# Patient Record
Sex: Female | Born: 1940 | Race: White | Hispanic: No | Marital: Single | Smoking: Never smoker
Health system: Southern US, Community
[De-identification: ages and names within clinical notes are randomized; demographics above are authoritative.]

## PROBLEM LIST (undated history)

## (undated) DIAGNOSIS — T4145XA Adverse effect of unspecified anesthetic, initial encounter: Secondary | ICD-10-CM

## (undated) DIAGNOSIS — F32A Depression, unspecified: Secondary | ICD-10-CM

## (undated) DIAGNOSIS — I272 Pulmonary hypertension, unspecified: Secondary | ICD-10-CM

## (undated) DIAGNOSIS — G629 Polyneuropathy, unspecified: Secondary | ICD-10-CM

## (undated) DIAGNOSIS — K589 Irritable bowel syndrome without diarrhea: Secondary | ICD-10-CM

## (undated) DIAGNOSIS — J342 Deviated nasal septum: Secondary | ICD-10-CM

## (undated) DIAGNOSIS — C50919 Malignant neoplasm of unspecified site of unspecified female breast: Secondary | ICD-10-CM

## (undated) DIAGNOSIS — T8859XA Other complications of anesthesia, initial encounter: Secondary | ICD-10-CM

## (undated) DIAGNOSIS — E7800 Pure hypercholesterolemia, unspecified: Secondary | ICD-10-CM

## (undated) DIAGNOSIS — Z9221 Personal history of antineoplastic chemotherapy: Secondary | ICD-10-CM

## (undated) DIAGNOSIS — E039 Hypothyroidism, unspecified: Secondary | ICD-10-CM

## (undated) DIAGNOSIS — G709 Myoneural disorder, unspecified: Secondary | ICD-10-CM

## (undated) DIAGNOSIS — I7300 Raynaud's syndrome without gangrene: Secondary | ICD-10-CM

## (undated) DIAGNOSIS — Z923 Personal history of irradiation: Secondary | ICD-10-CM

## (undated) DIAGNOSIS — M349 Systemic sclerosis, unspecified: Secondary | ICD-10-CM

## (undated) DIAGNOSIS — M1990 Unspecified osteoarthritis, unspecified site: Secondary | ICD-10-CM

## (undated) DIAGNOSIS — F419 Anxiety disorder, unspecified: Secondary | ICD-10-CM

## (undated) HISTORY — PX: APPENDECTOMY: SHX54

## (undated) HISTORY — PX: COLONOSCOPY: SHX174

## (undated) HISTORY — PX: CATARACT EXTRACTION: SUR2

## (undated) HISTORY — PX: NASAL SEPTUM SURGERY: SHX37

---

## 1986-06-25 DIAGNOSIS — C50919 Malignant neoplasm of unspecified site of unspecified female breast: Secondary | ICD-10-CM

## 1986-06-25 HISTORY — PX: MASTECTOMY: SHX3

## 1986-06-25 HISTORY — DX: Malignant neoplasm of unspecified site of unspecified female breast: C50.919

## 2004-10-29 ENCOUNTER — Ambulatory Visit: Payer: Self-pay | Admitting: Oncology

## 2004-12-31 ENCOUNTER — Ambulatory Visit: Payer: Self-pay | Admitting: Gastroenterology

## 2005-02-02 ENCOUNTER — Ambulatory Visit: Payer: Self-pay | Admitting: Oncology

## 2005-03-02 ENCOUNTER — Ambulatory Visit: Payer: Self-pay | Admitting: Oncology

## 2005-11-04 ENCOUNTER — Ambulatory Visit: Payer: Self-pay | Admitting: Oncology

## 2006-02-01 ENCOUNTER — Ambulatory Visit: Payer: Self-pay | Admitting: Oncology

## 2006-03-02 ENCOUNTER — Ambulatory Visit: Payer: Self-pay | Admitting: Oncology

## 2006-11-08 ENCOUNTER — Ambulatory Visit: Payer: Self-pay | Admitting: Oncology

## 2007-02-01 ENCOUNTER — Ambulatory Visit: Payer: Self-pay | Admitting: Oncology

## 2007-02-10 ENCOUNTER — Ambulatory Visit: Payer: Self-pay | Admitting: Oncology

## 2007-03-03 ENCOUNTER — Ambulatory Visit: Payer: Self-pay | Admitting: Oncology

## 2007-05-16 ENCOUNTER — Ambulatory Visit: Payer: Self-pay | Admitting: Oncology

## 2007-05-17 ENCOUNTER — Ambulatory Visit: Payer: Self-pay | Admitting: Oncology

## 2007-06-03 ENCOUNTER — Ambulatory Visit: Payer: Self-pay | Admitting: Oncology

## 2007-11-18 ENCOUNTER — Ambulatory Visit: Payer: Self-pay | Admitting: Oncology

## 2007-11-28 ENCOUNTER — Ambulatory Visit: Payer: Self-pay | Admitting: Oncology

## 2007-12-04 ENCOUNTER — Ambulatory Visit: Payer: Self-pay | Admitting: Oncology

## 2007-12-08 ENCOUNTER — Ambulatory Visit: Payer: Self-pay | Admitting: Internal Medicine

## 2008-02-06 ENCOUNTER — Ambulatory Visit: Payer: Self-pay | Admitting: Oncology

## 2008-02-28 ENCOUNTER — Ambulatory Visit: Payer: Self-pay | Admitting: Oncology

## 2008-03-02 ENCOUNTER — Ambulatory Visit: Payer: Self-pay | Admitting: Oncology

## 2008-05-02 ENCOUNTER — Ambulatory Visit: Payer: Self-pay | Admitting: Oncology

## 2008-12-12 ENCOUNTER — Ambulatory Visit: Payer: Self-pay | Admitting: Oncology

## 2009-01-31 ENCOUNTER — Ambulatory Visit: Payer: Self-pay | Admitting: Oncology

## 2009-02-26 ENCOUNTER — Ambulatory Visit: Payer: Self-pay | Admitting: Oncology

## 2009-03-02 ENCOUNTER — Ambulatory Visit: Payer: Self-pay | Admitting: Oncology

## 2009-12-03 ENCOUNTER — Ambulatory Visit: Payer: Self-pay | Admitting: Oncology

## 2009-12-31 ENCOUNTER — Ambulatory Visit: Payer: Self-pay | Admitting: Oncology

## 2010-02-25 ENCOUNTER — Ambulatory Visit: Payer: Self-pay | Admitting: Oncology

## 2010-03-02 ENCOUNTER — Ambulatory Visit: Payer: Self-pay | Admitting: Oncology

## 2010-12-24 ENCOUNTER — Ambulatory Visit: Payer: Self-pay | Admitting: Oncology

## 2010-12-30 ENCOUNTER — Ambulatory Visit: Payer: Self-pay | Admitting: Oncology

## 2011-01-01 ENCOUNTER — Ambulatory Visit: Payer: Self-pay | Admitting: Oncology

## 2011-11-30 ENCOUNTER — Ambulatory Visit: Payer: Self-pay | Admitting: Rheumatology

## 2012-01-01 ENCOUNTER — Ambulatory Visit: Payer: Self-pay | Admitting: Oncology

## 2012-01-05 ENCOUNTER — Ambulatory Visit: Payer: Self-pay | Admitting: Oncology

## 2012-01-11 ENCOUNTER — Ambulatory Visit: Payer: Self-pay | Admitting: Oncology

## 2012-01-11 LAB — COMPREHENSIVE METABOLIC PANEL
Anion Gap: 8 (ref 7–16)
BUN: 17 mg/dL (ref 7–18)
Calcium, Total: 9.4 mg/dL (ref 8.5–10.1)
Co2: 30 mmol/L (ref 21–32)
Creatinine: 1.03 mg/dL (ref 0.60–1.30)
EGFR (African American): >60
EGFR (Non-African Amer.): 56 — ABNORMAL LOW
Osmolality: 278 (ref 275–301)
Potassium: 4.3 mmol/L (ref 3.5–5.1)
SGOT(AST): 28 U/L (ref 15–37)
SGPT (ALT): 38 U/L

## 2012-01-11 LAB — CBC CANCER CENTER
Basophil %: 0.6 %
Eosinophil #: 0.2 x10 3/mm (ref 0.0–0.7)
Eosinophil %: 3.1 %
HCT: 37.7 % (ref 35.0–47.0)
HGB: 13.1 g/dL (ref 12.0–16.0)
MCH: 32.8 pg (ref 26.0–34.0)
MCHC: 34.8 g/dL (ref 32.0–36.0)
Monocyte #: 0.5 x10 3/mm (ref 0.0–0.7)
Monocyte %: 7.2 %
Neutrophil #: 4.6 x10 3/mm (ref 1.4–6.5)
Neutrophil %: 64.1 %
RBC: 3.99 10*6/uL (ref 3.80–5.20)

## 2012-02-01 ENCOUNTER — Ambulatory Visit: Payer: Self-pay | Admitting: Oncology

## 2012-04-26 ENCOUNTER — Ambulatory Visit: Payer: Self-pay | Admitting: Gastroenterology

## 2013-01-05 ENCOUNTER — Ambulatory Visit: Payer: Self-pay | Admitting: Oncology

## 2013-01-13 ENCOUNTER — Ambulatory Visit: Payer: Self-pay | Admitting: Oncology

## 2013-01-13 LAB — CBC CANCER CENTER
Eosinophil #: 0.2 x10 3/mm (ref 0.0–0.7)
Eosinophil %: 3.4 %
Lymphocyte #: 2 x10 3/mm (ref 1.0–3.6)
MCH: 31.6 pg (ref 26.0–34.0)
MCHC: 33.4 g/dL (ref 32.0–36.0)
Monocyte %: 7.6 %
Neutrophil #: 4.3 x10 3/mm (ref 1.4–6.5)
Neutrophil %: 59.4 %
Platelet: 274 x10 3/mm (ref 150–440)
RDW: 12.8 % (ref 11.5–14.5)
WBC: 7.2 x10 3/mm (ref 3.6–11.0)

## 2013-01-13 LAB — COMPREHENSIVE METABOLIC PANEL
Alkaline Phosphatase: 90 U/L (ref 50–136)
Bilirubin,Total: 0.8 mg/dL (ref 0.2–1.0)
EGFR (African American): >60
Glucose: 103 mg/dL — ABNORMAL HIGH (ref 65–99)
SGOT(AST): 25 U/L (ref 15–37)
SGPT (ALT): 46 U/L (ref 12–78)
Sodium: 137 mmol/L (ref 136–145)
Total Protein: 7.8 g/dL (ref 6.4–8.2)

## 2013-01-14 LAB — CANCER ANTIGEN 27.29: CA 27.29: 61.5 U/mL — ABNORMAL HIGH (ref 0.0–38.6)

## 2013-01-31 ENCOUNTER — Ambulatory Visit: Payer: Self-pay | Admitting: Oncology

## 2013-04-01 ENCOUNTER — Ambulatory Visit: Payer: Self-pay | Admitting: Emergency Medicine

## 2013-10-16 ENCOUNTER — Inpatient Hospital Stay: Payer: Self-pay | Admitting: Surgery

## 2013-10-16 LAB — COMPREHENSIVE METABOLIC PANEL
Albumin: 3.3 g/dL — ABNORMAL LOW (ref 3.4–5.0)
Alkaline Phosphatase: 75 U/L
Anion Gap: 5 — ABNORMAL LOW (ref 7–16)
Bilirubin,Total: 0.7 mg/dL (ref 0.2–1.0)
Calcium, Total: 9.2 mg/dL (ref 8.5–10.1)
Chloride: 102 mmol/L (ref 98–107)
Co2: 29 mmol/L (ref 21–32)
EGFR (African American): >60
Glucose: 127 mg/dL — ABNORMAL HIGH (ref 65–99)
SGOT(AST): 43 U/L — ABNORMAL HIGH (ref 15–37)
SGPT (ALT): 54 U/L (ref 12–78)
Sodium: 136 mmol/L (ref 136–145)
Total Protein: 7.8 g/dL (ref 6.4–8.2)

## 2013-10-16 LAB — CBC WITH DIFFERENTIAL/PLATELET
Basophil #: 0.1 10*3/uL (ref 0.0–0.1)
Basophil %: 1 %
Eosinophil #: 0 10*3/uL (ref 0.0–0.7)
HCT: 36.1 % (ref 35.0–47.0)
HGB: 12 g/dL (ref 12.0–16.0)
Lymphocyte #: 1.4 10*3/uL (ref 1.0–3.6)
Lymphocyte %: 11.4 %
MCH: 31.4 pg (ref 26.0–34.0)
MCHC: 33.2 g/dL (ref 32.0–36.0)
Monocyte #: 0.9 x10 3/mm (ref 0.2–0.9)
Monocyte %: 7.7 %
Platelet: 234 10*3/uL (ref 150–440)
RBC: 3.81 10*6/uL (ref 3.80–5.20)
RDW: 13.1 % (ref 11.5–14.5)
WBC: 11.9 10*3/uL — ABNORMAL HIGH (ref 3.6–11.0)

## 2013-10-16 LAB — URINALYSIS, COMPLETE
Bacteria: NONE SEEN
Bilirubin,UR: NEGATIVE
Blood: NEGATIVE
Glucose,UR: NEGATIVE mg/dL (ref 0–75)
Ph: 6 (ref 4.5–8.0)
Protein: NEGATIVE
RBC,UR: 1 /HPF (ref 0–5)
Squamous Epithelial: <1
WBC UR: 1 /HPF (ref 0–5)

## 2013-10-16 LAB — LIPASE, BLOOD: Lipase: 90 U/L (ref 73–393)

## 2013-10-17 LAB — BASIC METABOLIC PANEL
Anion Gap: 5 — ABNORMAL LOW (ref 7–16)
Calcium, Total: 8.4 mg/dL — ABNORMAL LOW (ref 8.5–10.1)
Creatinine: 0.85 mg/dL (ref 0.60–1.30)
EGFR (African American): >60
EGFR (Non-African Amer.): >60
Osmolality: 269 (ref 275–301)
Sodium: 133 mmol/L — ABNORMAL LOW (ref 136–145)

## 2013-10-17 LAB — CBC WITH DIFFERENTIAL/PLATELET
Basophil %: 0.1 %
Eosinophil #: 0 10*3/uL (ref 0.0–0.7)
Eosinophil %: 0 %
HCT: 34.3 % — ABNORMAL LOW (ref 35.0–47.0)
Lymphocyte %: 9 %
MCH: 32.5 pg (ref 26.0–34.0)
MCHC: 34.4 g/dL (ref 32.0–36.0)
MCV: 95 fL (ref 80–100)
Monocyte %: 9.3 %
Neutrophil #: 8.9 10*3/uL — ABNORMAL HIGH (ref 1.4–6.5)
RBC: 3.62 10*6/uL — ABNORMAL LOW (ref 3.80–5.20)
RDW: 13 % (ref 11.5–14.5)
WBC: 11 10*3/uL (ref 3.6–11.0)

## 2013-10-19 LAB — PATHOLOGY REPORT

## 2014-01-09 ENCOUNTER — Ambulatory Visit: Payer: Self-pay | Admitting: Oncology

## 2014-01-12 ENCOUNTER — Ambulatory Visit: Payer: Self-pay | Admitting: Oncology

## 2014-01-12 LAB — CBC CANCER CENTER
Basophil #: 0 x10 3/mm (ref 0.0–0.1)
Basophil %: 0.5 %
EOS ABS: 0.2 x10 3/mm (ref 0.0–0.7)
Eosinophil %: 3.3 %
HCT: 37.9 % (ref 35.0–47.0)
HGB: 12.8 g/dL (ref 12.0–16.0)
Lymphocyte #: 2.2 x10 3/mm (ref 1.0–3.6)
Lymphocyte %: 31.2 %
MCH: 31.9 pg (ref 26.0–34.0)
MCHC: 33.7 g/dL (ref 32.0–36.0)
MCV: 94 fL (ref 80–100)
MONO ABS: 0.6 x10 3/mm (ref 0.2–0.9)
Monocyte %: 8.7 %
Neutrophil #: 3.9 x10 3/mm (ref 1.4–6.5)
Neutrophil %: 56.3 %
Platelet: 329 x10 3/mm (ref 150–440)
RBC: 4.01 10*6/uL (ref 3.80–5.20)
RDW: 13 % (ref 11.5–14.5)
WBC: 6.9 x10 3/mm (ref 3.6–11.0)

## 2014-01-12 LAB — COMPREHENSIVE METABOLIC PANEL
ALT: 37 U/L (ref 12–78)
ANION GAP: 4 — AB (ref 7–16)
AST: 24 U/L (ref 15–37)
Albumin: 3.5 g/dL (ref 3.4–5.0)
Alkaline Phosphatase: 74 U/L
BUN: 17 mg/dL (ref 7–18)
Bilirubin,Total: 0.5 mg/dL (ref 0.2–1.0)
CALCIUM: 8.7 mg/dL (ref 8.5–10.1)
Chloride: 99 mmol/L (ref 98–107)
Co2: 32 mmol/L (ref 21–32)
Creatinine: 0.99 mg/dL (ref 0.60–1.30)
GFR CALC NON AF AMER: 57 — AB
GLUCOSE: 94 mg/dL (ref 65–99)
Osmolality: 271 (ref 275–301)
POTASSIUM: 4.2 mmol/L (ref 3.5–5.1)
Sodium: 135 mmol/L — ABNORMAL LOW (ref 136–145)
Total Protein: 7.5 g/dL (ref 6.4–8.2)

## 2014-01-26 DIAGNOSIS — F32A Depression, unspecified: Secondary | ICD-10-CM | POA: Insufficient documentation

## 2014-01-26 DIAGNOSIS — F329 Major depressive disorder, single episode, unspecified: Secondary | ICD-10-CM | POA: Insufficient documentation

## 2014-01-26 DIAGNOSIS — M8580 Other specified disorders of bone density and structure, unspecified site: Secondary | ICD-10-CM | POA: Insufficient documentation

## 2014-01-26 DIAGNOSIS — G4700 Insomnia, unspecified: Secondary | ICD-10-CM | POA: Insufficient documentation

## 2014-01-26 DIAGNOSIS — M19049 Primary osteoarthritis, unspecified hand: Secondary | ICD-10-CM | POA: Insufficient documentation

## 2014-01-26 DIAGNOSIS — M349 Systemic sclerosis, unspecified: Secondary | ICD-10-CM | POA: Insufficient documentation

## 2014-01-26 DIAGNOSIS — C50919 Malignant neoplasm of unspecified site of unspecified female breast: Secondary | ICD-10-CM | POA: Insufficient documentation

## 2014-01-26 DIAGNOSIS — E7800 Pure hypercholesterolemia, unspecified: Secondary | ICD-10-CM | POA: Insufficient documentation

## 2014-01-26 DIAGNOSIS — M1712 Unilateral primary osteoarthritis, left knee: Secondary | ICD-10-CM | POA: Insufficient documentation

## 2014-01-26 DIAGNOSIS — Z853 Personal history of malignant neoplasm of breast: Secondary | ICD-10-CM | POA: Insufficient documentation

## 2014-01-31 ENCOUNTER — Ambulatory Visit: Payer: Self-pay | Admitting: Oncology

## 2014-12-11 IMAGING — MG MM DIGITAL DIAGNOSTIC UNILAT*L* W/ CAD
1 series · 3 of 3 positions shown · non-contrast
Comparison: none

REASON FOR EXAM: hx brst ca rt mast
COMMENTS:

[L CC · left · 3 of 3 slices shown]
[im 1/3]
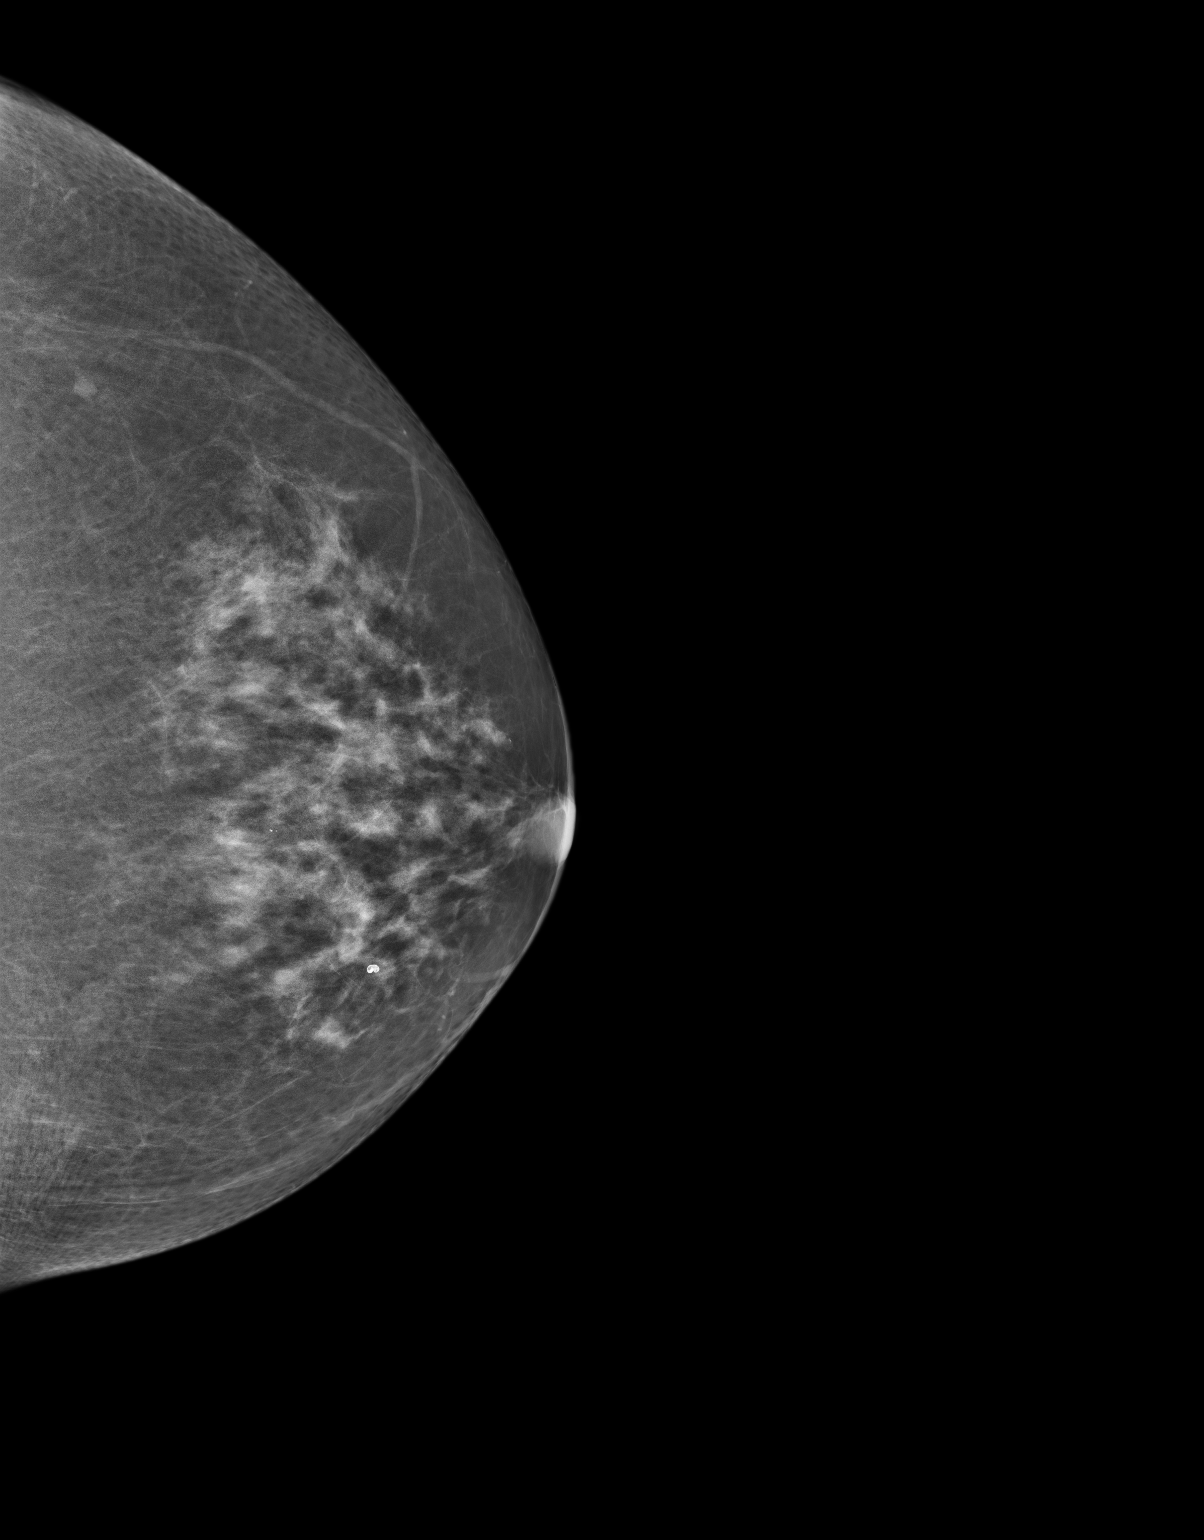
[im 2/3]
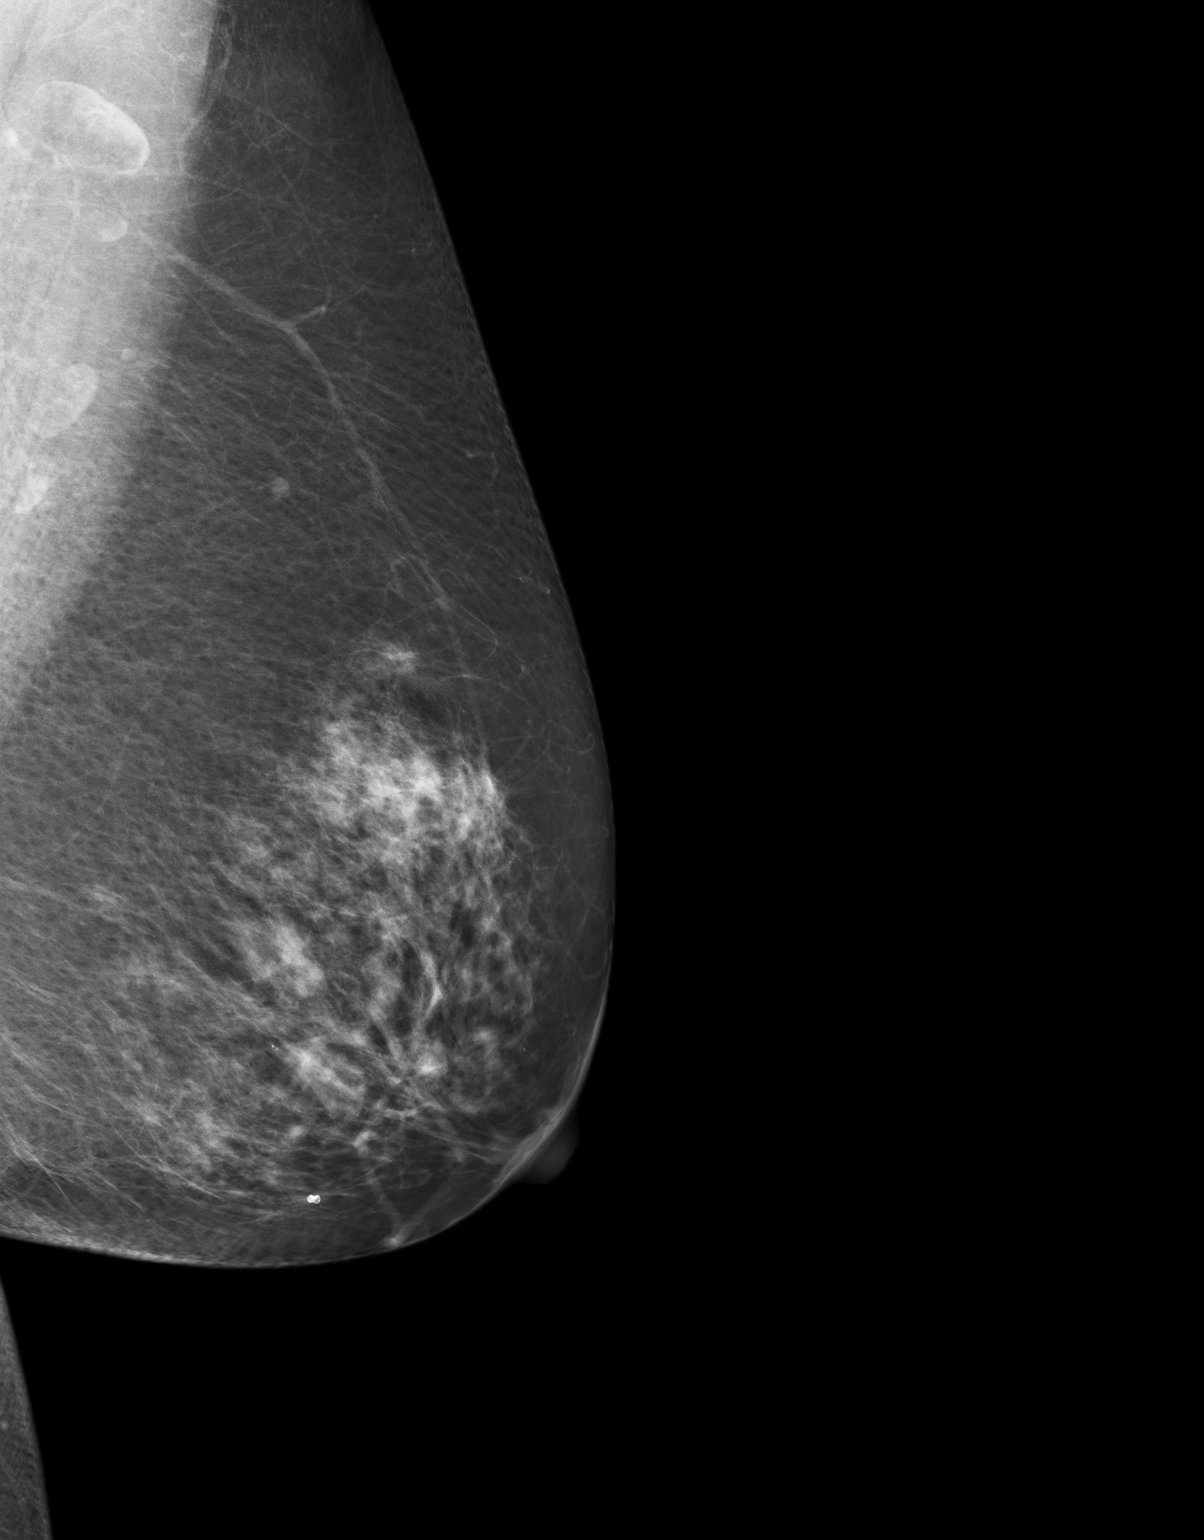
[im 3/3]
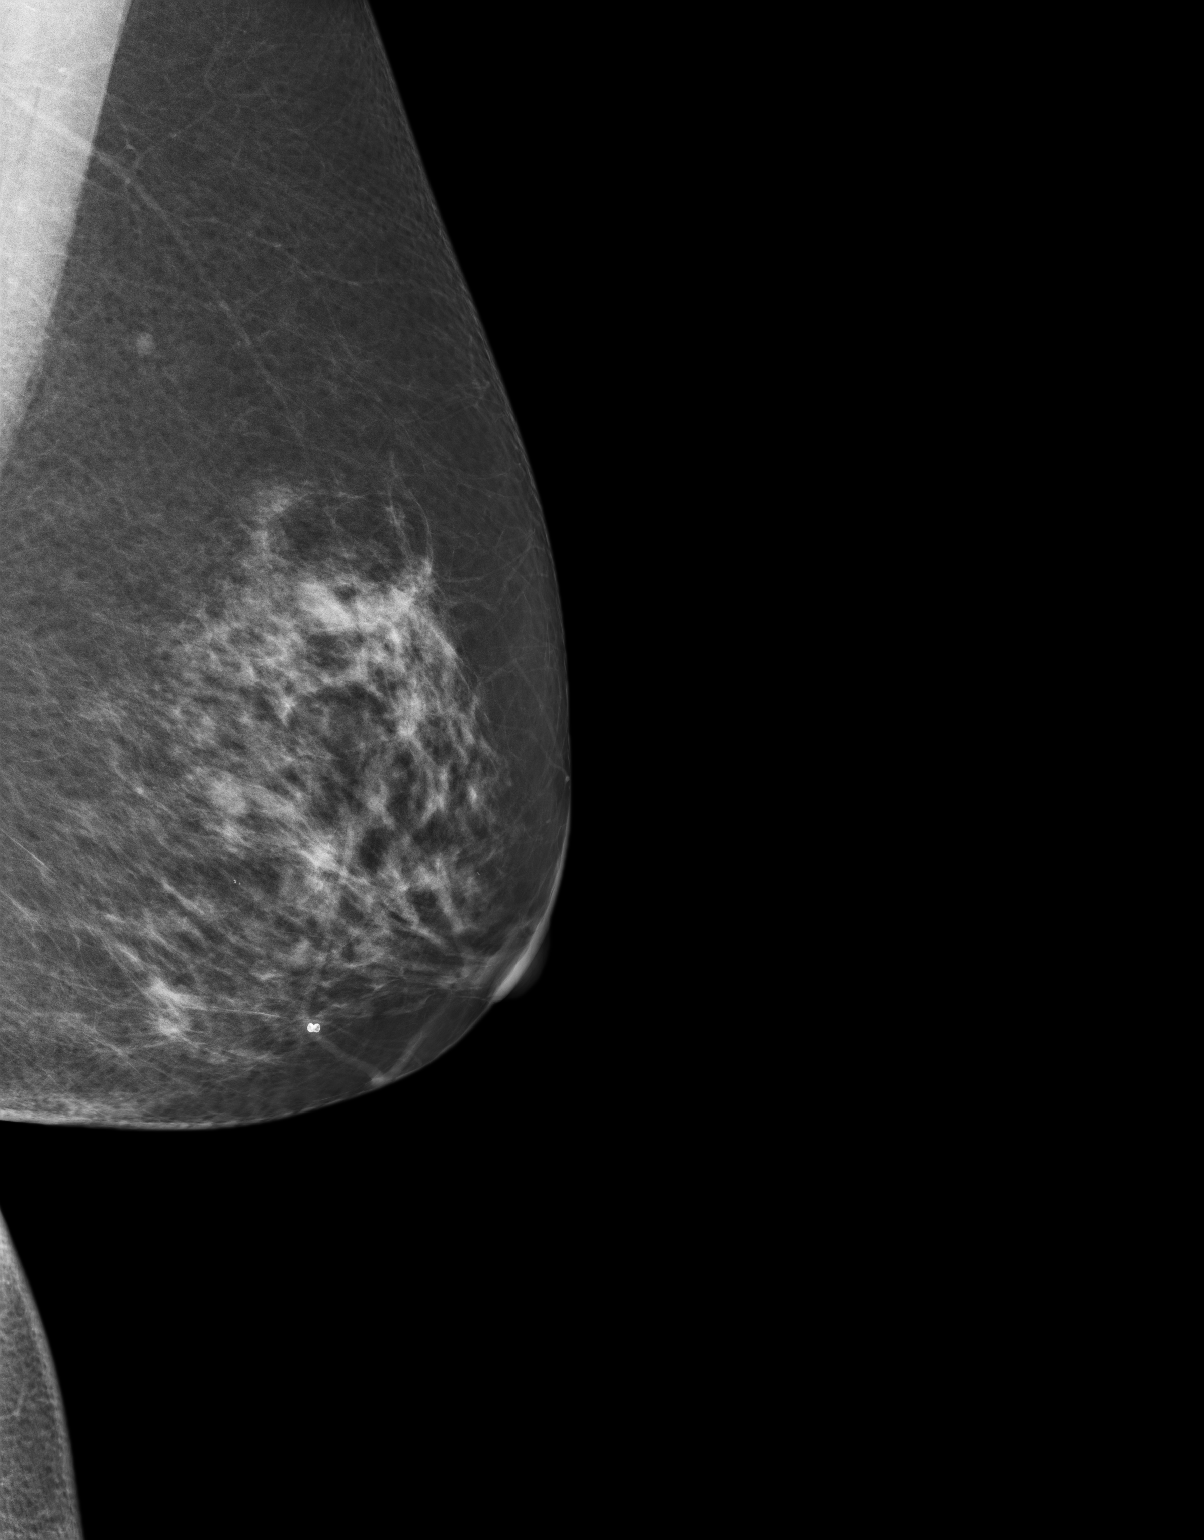

[3 of 3 positions shown; findings below may reference images not displayed]

PROCEDURE:     MMM - MMM DIG UNILATERAL LT W/CAD  - January 05, 2013 [DATE]

RESULT:     The patient has a history of right breast cancer diagnosed in
[age] undergoing mastectomy with radiation therapy and chemotherapy. There is
a family history of breast cancer in a paternal aunt. Comparison is made to
unilateral digital images from 05 January, 2012, [REDACTED] and December 2010. Left breast images demonstrate a moderate density with
scattered fibroglandular elements without a developing density or dominant
mass. No malignant calcification or architectural distortion is evident.
IMPRESSION: Stable, benign appearing unilateral left breast mammogram.
Please continue to encourage appropriate mammographic followup and monthly
breast self exam. BREAST COMPOSITION: The breast composition is SCATTERED
FIBROGLANDULAR TISSUE (glandular tissue is 25-50%) BI-RADS: Category 2-
Benign Finding

A NEGATIVE MAMMOGRAM REPORT DOES NOT PRECLUDE BIOPSY OR OTHER EVALUATION OF
A CLINICALLY PALPABLE OR OTHERWISE SUSPICIOUS MASS OR LESION. BREAST CANCER
MAY NOT BE DETECTED IN UP TO 10% OF CASES.

[REDACTED]

## 2015-01-29 ENCOUNTER — Ambulatory Visit: Payer: Self-pay | Admitting: Oncology

## 2015-01-31 ENCOUNTER — Ambulatory Visit
Admit: 2015-01-31 | Disposition: A | Discharge status: Home / Self Care | Payer: Self-pay | Attending: Oncology | Admitting: Oncology

## 2015-01-31 LAB — CBC CANCER CENTER
BASOS ABS: 0.1 x10 3/mm (ref 0.0–0.1)
BASOS PCT: 0.7 %
EOS ABS: 0.1 x10 3/mm (ref 0.0–0.7)
Eosinophil %: 1.2 %
HCT: 37.9 % (ref 35.0–47.0)
HGB: 12.9 g/dL (ref 12.0–16.0)
LYMPHS ABS: 2 x10 3/mm (ref 1.0–3.6)
Lymphocyte %: 27.9 %
MCH: 32.1 pg (ref 26.0–34.0)
MCHC: 33.9 g/dL (ref 32.0–36.0)
MCV: 95 fL (ref 80–100)
MONO ABS: 0.6 x10 3/mm (ref 0.2–0.9)
Monocyte %: 8.2 %
Neutrophil #: 4.4 x10 3/mm (ref 1.4–6.5)
Neutrophil %: 62 %
Platelet: 282 x10 3/mm (ref 150–440)
RBC: 4.01 10*6/uL (ref 3.80–5.20)
RDW: 13 % (ref 11.5–14.5)
WBC: 7.1 x10 3/mm (ref 3.6–11.0)

## 2015-01-31 LAB — COMPREHENSIVE METABOLIC PANEL WITH GFR
Albumin: 4.5 g/dL
Alkaline Phosphatase: 47 U/L
Anion Gap: 3 — ABNORMAL LOW
BUN: 18 mg/dL
Bilirubin,Total: 1.1 mg/dL
Calcium, Total: 10.2 mg/dL
Chloride: 101 mmol/L
Co2: 31 mmol/L
Creatinine: 0.93 mg/dL
EGFR (African American): >60
EGFR (Non-African Amer.): >60
Glucose: 114 mg/dL — ABNORMAL HIGH
Potassium: 4.6 mmol/L
SGOT(AST): 29 U/L
SGPT (ALT): 24 U/L
Sodium: 135 mmol/L
Total Protein: 7.9 g/dL

## 2015-02-01 ENCOUNTER — Ambulatory Visit
Admit: 2015-02-01 | Disposition: A | Discharge status: Home / Self Care | Payer: Self-pay | Attending: Oncology | Admitting: Oncology

## 2015-02-22 NOTE — Op Note (Signed)
PATIENT NAME:  Veronica Hoffman, Veronica Hoffman MR#:  [id number] DATE OF BIRTH:  [birth year]  DATE OF PROCEDURE:  10/16/2013  PREOPERATIVE DIAGNOSIS: Acute appendicitis.   POSTOPERATIVE DIAGNOSIS: Acute appendicitis.   PROCEDURE PERFORMED: Laparoscopic appendectomy.   SURGEON: Norva Bowe A. Marina Gravel, M.D. FACS  ASSISTANT: ST  ANESTHESIA: General endotracheal.   FINDINGS: Acute appendicitis with microperforation.   SPECIMENS: Appendix to pathology.   ESTIMATED BLOOD LOSS: Less than 25 mL.   DRAINS: None.   LAP AND NEEDLE COUNT: Correct x2.   DESCRIPTION OF PROCEDURE: With informed consent, supine position, general endotracheal anesthesia, the patient's arms were padded and tucked at her side. Her abdomen was widely prepped and draped with ChloraPrep solution. Timeout was observed. A 12 mm blunt Hassan trocar was placed through an open technique with stay sutures being passed through the fascia. Pneumoperitoneum was established. There were adhesions of the omentum to the anterior abdominal wall in the right side along the course of the mid ascending colon. This was taken down with sharp dissection after placement of a left lower quadrant 5 mm bladeless trocar. With sufficient abdominal wall right-of-way, I placed a 12 mm trocar in the right upper quadrant. There was free fluid and small amount of pus in the abdomen. In the right lower quadrant, dissection demonstrated an appendix wrapped around the terminal ileum. This was carefully teased away with a combination of blunt technique, sharp technique, and the application of the Harmonic scalpel device. The base of the appendix was clearly identified, the mesoappendix being taken with sequential small bites with the endoscopic Harmonic scalpel. The base of the appendix was clearly identified. The base of the appendix was then transected with a single fire of the endoscopic stapler with blue load application. The specimen was captured in an Endo Catch device and retrieved.  Pneumoperitoneum was re-established. The right lower quadrant was copiously irrigated and aspirated dry and hemostasis appeared to be excellent on the operative field. Ports were then removed under direct visualization with the infraumbilical fascial defect being reapproximated with an additional figure-of-eight #0 Vicryl suture in vertical orientation. Stay sutures being tied to each other, a total of 20 mL of 0.25% plain Marcaine was infiltrated prior to closure along all skin and fascial incisions. 4-0 Vicryl subcuticular followed by benzoin, Steri-Strips, Telfa, and Tegaderm were then applied, and the patient was subsequently extubated and taken to the recovery room in stable and satisfactory condition by anesthesia services. ____________________________ Jeannette How Marina Gravel, MD mab:sb D: 10/16/2013 16:32:31 ET T: 10/16/2013 17:14:51 ET JOB#: [id number]  cc: Elta Guadeloupe A. Marina Gravel, MD, <Dictator> Clyde Zarrella A Dacia Capers MD ELECTRONICALLY SIGNED 10/24/2013 8:30

## 2015-02-22 NOTE — H&P (Signed)
Subjective/Chief Complaint abdominal pain, nausea and diarrhea   History of Present Illness 74 y/o female with remote hx breast cancer presents to ER with several day history of vague abdominal pain associated with diarrhea on Thursday and nausea today.  No relief with OTC remedies.  Seen in ER and found to be tachycardic and CT scan shows acute appendicitis. Denies sick contacts and dysuria.   Past History breast cancer repair deviated nasal sectum.   Past Med/Surgical Hx:  spastic colon:   septal repair:   Mastectomy:   ALLERGIES:  Codeine: N/V/Diarrhea  Demerol: N/V/Diarrhea  Darvon: N/V/Diarrhea  Aspirin: Bleeding   Other Allergies none   HOME MEDICATIONS: Medication Instructions Status  NIFEdipine 30 mg oral tablet, extended release 1 tab(s) orally once a day Active  Lipitor 20 mg oral tablet 1 tab(s) orally once a day (at bedtime) Active  Lexapro 5 mg oral tablet 1 tab(s) orally once a day (at bedtime) Active   Family and Social History:  Family History Non-Contributory   Social History negative tobacco, negative ETOH, negative Illicit drugs   Place of Living Home   Review of Systems:  Subjective/Chief Complaint see above.   Abdominal Pain Yes   Diarrhea Yes   Nausea/Vomiting Yes  no vomiting.   Medications/Allergies Reviewed Medications/Allergies reviewed   Physical Exam:  GEN no acute distress, disheveled   HEENT pale conjunctivae, PERRL   NECK supple   RESP normal resp effort  clear BS   CARD regular rate  no murmur  no thrills  No LE edema  no JVD  no Rub   ABD positive tenderness  no hernia  soft  mild tenderness in lower mid abdomen.   LYMPH negative neck   EXTR negative cyanosis/clubbing   SKIN normal to palpation   NEURO cranial nerves intact   PSYCH A+O to time, place, person, good insight   Lab Results: Hepatic:  15-Dec-14 07:15   Bilirubin, Total 0.7  Alkaline Phosphatase 75 (45-117 NOTE: New Reference Range 09/22/13)   SGPT (ALT) 54  SGOT (AST)  43  Total Protein, Serum 7.8  Albumin, Serum  3.3  Routine Chem:  15-Dec-14 07:15   Glucose, Serum  127  BUN 10  Creatinine (comp) 0.76  Sodium, Serum 136  Potassium, Serum 3.9  Chloride, Serum 102  CO2, Serum 29  Calcium (Total), Serum 9.2  Osmolality (calc) 273  eGFR (African American) >60  eGFR (Non-African American) >60 (eGFR values <8mL/min/1.73 m2 may be an indication of chronic kidney disease (CKD). Calculated eGFR is useful in patients with stable renal function. The eGFR calculation will not be reliable in acutely ill patients when serum creatinine is changing rapidly. It is not useful in  patients on dialysis. The eGFR calculation may not be applicable to patients at the low and high extremes of body sizes, pregnant women, and vegetarians.)  Anion Gap  5  Lipase 90 (Result(s) reported on 16 Oct 2013 at 07:46AM.)  Routine Hem:  15-Dec-14 07:15   WBC (CBC)  11.9  RBC (CBC) 3.81  Hemoglobin (CBC) 12.0  Hematocrit (CBC) 36.1  Platelet Count (CBC) 234  MCV 95  MCH 31.4  MCHC 33.2  RDW 13.1  Neutrophil % 79.7  Lymphocyte % 11.4  Monocyte % 7.7  Eosinophil % 0.2  Basophil % 1.0  Neutrophil #  9.5  Lymphocyte # 1.4  Monocyte # 0.9  Eosinophil # 0.0  Basophil # 0.1 (Result(s) reported on 16 Oct 2013 at 07:35AM.)   Radiology Results:  XRay:    15-Dec-14 07:27, Abdomen 3 Way Includes PA Chest  Abdomen 3 Way Includes PA Chest  REASON FOR EXAM:    abd pain  COMMENTS:       PROCEDURE: DXR - DXR ABDOMEN 3-WAY (INCL PA CXR)  - Oct 16 2013  7:27AM     CLINICAL DATA:  Abdominal pain and diarrhea.    EXAM:  ACUTE ABDOMEN SERIES (2 VIEW ABDOMEN AND 1 VIEW CHEST)    COMPARISON:  None.    FINDINGS:  Frontal view of the chest shows midline trachea and normal heart  size. Mild biapical pleural thickening with linear scarring at the  right lung apex. Linear opacities are seen at the lung bases, left  greater than right. No  airspaceconsolidation or pleural fluid.    Two views of the abdomen show a fair amount of stool in the colon.  No small bowel dilatation. No unexpected radiopaque calculi.     IMPRESSION:  1. Bowel gas pattern is suggestive of constipation.  2. Bibasilar subsegmental atelectasis and/or scarring.      Electronically Signed    By: Lorin Picket M.D.    On: 10/16/2013 07:42     Verified By: Luretha Rued, M.D.,  LabUnknown:  PACS Image    15-Dec-14 09:20, CT Abdomen and Pelvis With Contrast  PACS Image  CT:  CT Abdomen and Pelvis With Contrast  REASON FOR EXAM:    (1) abd pain, tachycardia, leukocytosis; (2) abd   pain, tachycardia, leukocytosis  COMMENTS:   May transport without cardiac monitor    PROCEDURE: CT  - CT ABDOMEN / PELVIS  W  - Oct 16 2013  9:20AM     CLINICAL DATA:  Abdominal pain, leukocytosis, tachycardia.    EXAM:  CT ABDOMEN AND PELVIS WITH CONTRAST    TECHNIQUE:  Multidetector CT imaging of the abdomen and pelvis was performed  using the standard protocol following bolus administration of  intravenous contrast.  CONTRAST:  115 cc Isovue 370.    COMPARISON:  None.    FINDINGS:  Lung bases show scattered scarring. Suspect subpleural radiation  fibrosis in the right middle lobe. Heart is at the upper limits of  normal in size. No pericardial effusion. Probable small hiatal  hernia.    Liver, gallbladder and adrenal glands are unremarkable. There may be  scattered millimetric low-attenuation lesions in the kidneys, too  small to characterize. Spleen, pancreas, stomach and small bowel are  unremarkable.  The mid and distal portions of the appendix are low-attenuation and  dilated, measuring up to 1.5 cm (series 2, image 61). There is  surrounding inflammatory haziness and stranding. No discrete fluid  collection or free air. Scattered adjacent lymph nodes are  subcentimeter in size. Colon is otherwise unremarkable.    Uterus and ovaries are  visualized. Small bilateral inguinal hernias  contain fat. No pathologically enlarged lymph nodes. No free fluid.  Scattered atherosclerotic calcification of the arterial vasculature  without abdominal aortic aneurysm. No worrisome lytic or sclerotic  lesions.     IMPRESSION:  Dilated appendix with surrounding inflammatory changes, most  consistent with acute appendicitis. Although considered much less  likely, mucocele cannot be definitively excluded.      Electronically Signed    By: Lorin Picket M.D.    On: 10/16/2013 09:30         Verified By: Luretha Rued, M.D.,    Assessment/Admission Diagnosis 74 y/o female with acute appendicitis   Plan admit, laparoscopic  appendectomy, discussed with patient the procedure and risks of surgery including bleeding, infection, recovery and post op ileus.  She also understands need for possible open procedure.   Electronic Signatures: Sherri Rad (MD)  (Signed 15-Dec-14 12:14)  Authored: CHIEF COMPLAINT and HISTORY, PAST MEDICAL/SURGIAL HISTORY, ALLERGIES, Other Allergies, HOME MEDICATIONS, FAMILY AND SOCIAL HISTORY, REVIEW OF SYSTEMS, PHYSICAL EXAM, LABS, Radiology, ASSESSMENT AND PLAN   Last Updated: 15-Dec-14 12:14 by Sherri Rad (MD)

## 2015-02-23 NOTE — Discharge Summary (Signed)
PATIENT NAME:  Veronica Hoffman, Veronica Hoffman MR#:  [id number] DATE OF BIRTH:  1940-12-10  DATE OF ADMISSION:  10/16/2013 DATE OF DISCHARGE:  10/20/2013  FINAL DIAGNOSIS:  Acute perforated appendicitis.   HOSPITAL COURSE SUMMARY: The patient was admitted with abdominal pain, found to have acute appendicitis on the 15th of December.  Had a laparoscopic appendectomy with uneventful postoperative course. She did have a mild postoperative ileus. On postoperative day #1, she  seemed to be doing well. On postoperative day #2, she had several loose stools and passage of gas and was continued on intravenous ertapenem.  Her diet was able to be advanced from n.p.o. to clear liquids. On postoperative day #3, she had multiple bowel movements. C. difficile assay was obtained. This came back negative. On postoperative day #4, she was tolerating a regular diet. Her incision seemed to be healing nicely with no signs of infection. She was discharged home on oral antibiotics to follow up with me in the office in 1 week's time.   DISCHARGE MEDICATIONS: 1.  Nifedipine 30 mg extended release tablet once a day.  2.  Lipitor 20 mg by mouth once a day.  3.  Lexapro 5 mg by mouth once a day.  4.  Acetaminophen/oxycodone 5/325, 1 tab every 6 hours.  5.  Protonix 40 mg by mouth delayed-release tablet, 1 tab by mouth once a day.  6.  Augmentin 500/125, 1 tab every 8 hours for 5 more days.   ____________________________ Jeannette How Marina Gravel, MD mab:dmm D: 11/01/2013 20:32:03 ET T: 11/01/2013 21:56:25 ET JOB#: [id number]  cc: Elta Guadeloupe A. Marina Gravel, MD, <Dictator> Hortencia Conradi MD ELECTRONICALLY SIGNED 11/02/2013 19:53

## 2015-02-27 LAB — CANCER ANTIGEN 27.29: CA 27.29: 62.2 U/mL — ABNORMAL HIGH (ref 0.0–38.6)

## 2015-06-07 ENCOUNTER — Ambulatory Visit
Admission: RE | Admit: 2015-06-07 | Discharge: 2015-06-07 | Disposition: A | Discharge status: Home / Self Care | Payer: Medicare Other | Source: Ambulatory Visit | Attending: Cardiology | Admitting: Cardiology

## 2015-06-07 ENCOUNTER — Encounter
Admission: RE | Disposition: A | Discharge status: Home / Self Care | Payer: Self-pay | Source: Ambulatory Visit | Attending: Cardiology

## 2015-06-07 DIAGNOSIS — Z888 Allergy status to other drugs, medicaments and biological substances status: Secondary | ICD-10-CM | POA: Diagnosis not present

## 2015-06-07 DIAGNOSIS — Z8249 Family history of ischemic heart disease and other diseases of the circulatory system: Secondary | ICD-10-CM | POA: Insufficient documentation

## 2015-06-07 DIAGNOSIS — I272 Other secondary pulmonary hypertension: Secondary | ICD-10-CM | POA: Diagnosis not present

## 2015-06-07 DIAGNOSIS — I7300 Raynaud's syndrome without gangrene: Secondary | ICD-10-CM | POA: Diagnosis not present

## 2015-06-07 DIAGNOSIS — E780 Pure hypercholesterolemia: Secondary | ICD-10-CM | POA: Diagnosis not present

## 2015-06-07 DIAGNOSIS — R000 Tachycardia, unspecified: Secondary | ICD-10-CM | POA: Diagnosis not present

## 2015-06-07 DIAGNOSIS — M8580 Other specified disorders of bone density and structure, unspecified site: Secondary | ICD-10-CM | POA: Diagnosis not present

## 2015-06-07 DIAGNOSIS — Z853 Personal history of malignant neoplasm of breast: Secondary | ICD-10-CM | POA: Insufficient documentation

## 2015-06-07 DIAGNOSIS — Z833 Family history of diabetes mellitus: Secondary | ICD-10-CM | POA: Insufficient documentation

## 2015-06-07 DIAGNOSIS — E785 Hyperlipidemia, unspecified: Secondary | ICD-10-CM | POA: Diagnosis not present

## 2015-06-07 DIAGNOSIS — Z825 Family history of asthma and other chronic lower respiratory diseases: Secondary | ICD-10-CM | POA: Insufficient documentation

## 2015-06-07 DIAGNOSIS — Z91048 Other nonmedicinal substance allergy status: Secondary | ICD-10-CM | POA: Diagnosis not present

## 2015-06-07 DIAGNOSIS — L900 Lichen sclerosus et atrophicus: Secondary | ICD-10-CM | POA: Insufficient documentation

## 2015-06-07 DIAGNOSIS — Z9889 Other specified postprocedural states: Secondary | ICD-10-CM | POA: Insufficient documentation

## 2015-06-07 DIAGNOSIS — M19049 Primary osteoarthritis, unspecified hand: Secondary | ICD-10-CM | POA: Insufficient documentation

## 2015-06-07 DIAGNOSIS — M170 Bilateral primary osteoarthritis of knee: Secondary | ICD-10-CM | POA: Diagnosis not present

## 2015-06-07 DIAGNOSIS — Z885 Allergy status to narcotic agent status: Secondary | ICD-10-CM | POA: Diagnosis not present

## 2015-06-07 DIAGNOSIS — M349 Systemic sclerosis, unspecified: Secondary | ICD-10-CM | POA: Insufficient documentation

## 2015-06-07 DIAGNOSIS — Z823 Family history of stroke: Secondary | ICD-10-CM | POA: Diagnosis not present

## 2015-06-07 DIAGNOSIS — Z9010 Acquired absence of unspecified breast and nipple: Secondary | ICD-10-CM | POA: Insufficient documentation

## 2015-06-07 DIAGNOSIS — Z79899 Other long term (current) drug therapy: Secondary | ICD-10-CM | POA: Diagnosis not present

## 2015-06-07 DIAGNOSIS — G4700 Insomnia, unspecified: Secondary | ICD-10-CM | POA: Insufficient documentation

## 2015-06-07 DIAGNOSIS — F329 Major depressive disorder, single episode, unspecified: Secondary | ICD-10-CM | POA: Diagnosis not present

## 2015-06-07 DIAGNOSIS — Z886 Allergy status to analgesic agent status: Secondary | ICD-10-CM | POA: Insufficient documentation

## 2015-06-07 DIAGNOSIS — Z9849 Cataract extraction status, unspecified eye: Secondary | ICD-10-CM | POA: Diagnosis not present

## 2015-06-07 HISTORY — PX: CARDIAC CATHETERIZATION: SHX172

## 2015-06-07 SURGERY — RIGHT HEART CATH
Anesthesia: Moderate Sedation

## 2015-06-07 MED ORDER — MIDAZOLAM HCL 2 MG/2ML IJ SOLN
INTRAMUSCULAR | Status: DC | PRN
  Administered 2015-06-07: 1 mg via INTRAVENOUS
  Administered 2015-06-07: 0.5 mg via INTRAVENOUS

## 2015-06-07 MED ORDER — FENTANYL CITRATE (PF) 100 MCG/2ML IJ SOLN
INTRAMUSCULAR | Status: DC | PRN
  Administered 2015-06-07: 25 ug via INTRAVENOUS
  Administered 2015-06-07: 50 ug via INTRAVENOUS

## 2015-06-07 MED ORDER — SODIUM CHLORIDE 0.9 % IV SOLN
INTRAVENOUS | Status: DC
  Administered 2015-06-07: 08:00:00 via INTRAVENOUS

## 2015-06-07 MED ORDER — FENTANYL CITRATE (PF) 100 MCG/2ML IJ SOLN
INTRAMUSCULAR | Status: AC
  Filled 2015-06-07: qty 2

## 2015-06-07 MED ORDER — MIDAZOLAM HCL 2 MG/2ML IJ SOLN
INTRAMUSCULAR | Status: AC
  Filled 2015-06-07: qty 2

## 2015-06-07 MED ORDER — HEPARIN (PORCINE) IN NACL 2-0.9 UNIT/ML-% IJ SOLN
INTRAMUSCULAR | Status: AC
  Filled 2015-06-07: qty 500

## 2015-06-07 SURGICAL SUPPLY — 5 items
CATH SWANZ 7F THERMO (CATHETERS) ×2 IMPLANT
KIT MANI 3VAL PERCEP (MISCELLANEOUS) ×2 IMPLANT
NEEDLE PERC 18GX7CM (NEEDLE) ×2 IMPLANT
PACK CARDIAC CATH (CUSTOM PROCEDURE TRAY) ×2 IMPLANT
SHEATH PINNACLE 7F 10CM (SHEATH) ×2 IMPLANT

## 2015-06-07 NOTE — Discharge Instructions (Addendum)

## 2015-06-07 NOTE — H&P (Signed)
Chief Complaint: Chief Complaint  Patient presents with  . Establish Care  per patient she had a sleep study and it showed tachycardia. Pt had echo that showed pulmonary hypertension but, dr Raul Del said no  Date of Service: 06/04/2015 Date of Birth: 09-05-1941 PCP: Sherrin Daisy, MD  History of Present Illness: Veronica Hoffman is a 74 y.o.female patient who presented referral for evaluation of pulmonary hypertension. Patient has a history of Raynaud's disease and hyperlipidemia. She recently had an echocardiogram which revealed evidence of mild pulmonary hypertension with a right ventricular systolic pressure estimated in the mid 40s. She also has scleroderma. She denies any shortness of breath. She underwent a sleep study which revealed borderline sleep apnea but no high-grade desaturation. She complains of some fatigue but no but no shortness of breath or chest pain. She was noted to have some nonsustained wide complex tachycardia during her sleep study. She does not complain of syncope or presyncope otherwise. She has a family history of coronary artery disease.  Past Medical and Surgical History  Past Medical History Past Medical History  Diagnosis Date  . Raynaud's disease  . Hyperlipidemia  . Insomnia  . Depression  . Osteoarthritis of both knees  . Osteoarthritis of hand  . Scleroderma  Positive FANA and anti Scl 70. Positive family history. Mildly elevated RVSP. Raynaud's.  . Cancer  Breast  . Osteopenia  . Lichen sclerosus  . Chickenpox  . Cataracts, bilateral   Past Surgical History She has past surgical history that includes lens eye surgery (Bilateral, 2013); Mastectomy; Cataract extraction; Appendectomy; Colonoscopy (04/26/2012); Nasal surgery; and Dilation and curettage, diagnostic / therapeutic.   Medications and Allergies  Current Medications  Current Outpatient Prescriptions  Medication Sig Dispense Refill  . acetaminophen (TYLENOL) 650 MG ER tablet Take 650 mg by mouth  every 8 (eight) hours as needed for Pain.  Marland Kitchen atorvastatin (LIPITOR) 40 MG tablet Take 1 tablet (40 mg total) by mouth once daily. 30 tablet 0  . calcium carbonate-vitamin D3 (OS-CAL 500+D) 500 mg(1,250mg ) -200 unit tablet Take 1 tablet by mouth 2 (two) times daily with meals.  . cholecalciferol (CHOLECALCIFEROL) 1,000 unit tablet Take 185 mg by mouth.  . escitalopram oxalate (LEXAPRO) 10 MG tablet Take 1 tablet (10 mg total) by mouth once daily. 90 tablet 1  . fenofibrate micronized (LOFIBRA) 67 MG capsule Take 1 capsule (67 mg total) by mouth once daily. 90 capsule 1  . gabapentin (NEURONTIN) 100 MG capsule Take 1 capsule (100 mg total) by mouth 3 (three) times daily. 90 capsule 11  . ginseng 100 mg Cap Take by mouth.  Marland Kitchen GLUC SU/CHONDRO SU A/VIT C/MN (GLUCOSAMINE 1500 COMPLEX ORAL) Take by mouth.  . loratadine (CLARITIN) 10 mg tablet Take 10 mg by mouth once daily.  . multivitamin tablet Take 1 tablet by mouth daily.  . niFEdipine (ADALAT CC) 30 MG ER tablet Take 1 tablet (30 mg total) by mouth once daily. 90 tablet 3  . UNABLE TO FIND Vitamin b12 1,000 mcg po once day  . vitamin E 400 UNIT capsule Take 400 Units by mouth daily.   No current facility-administered medications for this visit.   Allergies: Codeine; Meperidine; Propoxyphene; Adhesive bandage; Aspirin; Amlodipine; Boniva; and Fosamax  Social and Family History  Social History reports that she has never smoked. She has never used smokeless tobacco. She reports that she does not drink alcohol or use illicit drugs.  Family History Family History  Problem Relation Age of Onset  . Stroke  Mother  . Hypertension Mother  . Diabetes mellitus Father  . Heart attack Father  . COPD Father  . Heart disease Sister  . Scleroderma Sister  . Diabetes mellitus Paternal Grandfather   Review of Systems  Review of Systems  Constitutional: Negative for fever, chills, weight loss, malaise/fatigue and diaphoresis.  HENT: Negative for  congestion, ear discharge, hearing loss and tinnitus.  Eyes: Negative for blurred vision.  Respiratory: Negative for cough, hemoptysis, sputum production and wheezing.  Cardiovascular: Negative for palpitations, orthopnea, claudication, leg swelling and PND.  Gastrointestinal: Negative for heartburn, nausea, vomiting, abdominal pain, diarrhea, constipation, blood in stool and melena.  Genitourinary: Negative for dysuria, urgency, frequency and hematuria.  Musculoskeletal: Negative for myalgias, back pain, joint pain and falls.  Skin: Negative for itching and rash.  Neurological: Negative for dizziness, tingling, focal weakness, loss of consciousness, weakness and headaches.  Endo/Heme/Allergies: Negative for polydipsia. Does not bruise/bleed easily.  Psychiatric/Behavioral: Negative for depression, memory loss and substance abuse. The patient is not nervous/anxious.    Physical Examination   Vitals:BP 120/70 mmHg  Pulse 100  Resp 10  Ht 167.6 cm (5\' 6" )  Wt 70.308 kg (155 lb)  BMI 25.03 kg/m2 Ht:167.6 cm (5\' 6" ) Wt:70.308 kg (155 lb) KDT:OIZT surface area is 1.81 meters squared. Body mass index is 25.03 kg/(m^2).  Wt Readings from Last 3 Encounters:  06/04/15 70.308 kg (155 lb)  05/13/15 70.761 kg (156 lb)  03/22/15 70.761 kg (156 lb)   BP Readings from Last 3 Encounters:  06/04/15 120/70  05/13/15 144/88  03/22/15 148/82   General appearance appears in no acute distress  Head Mouth and Eye exam Normocephalic, without obvious abnormality, atraumatic Dentition is good Eyes appear anicteric   Neck exam Thyroid: normal  Nodes: no obvious adenopathy  LUNGS Breath Sounds: Normal Percussion: Normal  CARDIOVASCULAR JVP CV wave: no HJR: no Elevation at 90 degrees: None Carotid Pulse: normal pulsation bilaterally Bruit: None Apex: apical impulse normal  Auscultation Rhythm: normal sinus rhythm S1: normal S2: normal Clicks: no Rub: no Murmurs: 2/6 medium  pitched mid systolic blowing at lower left sternal border  Gallop: None ABDOMEN Liver enlargement: no Pulsatile aorta: no Ascites: no Bruits: no  EXTREMITIES Clubbing: no Edema: trace to 1+ bilateral pedal edema Pulses: peripheral pulses symmetrical Femoral Bruits: no Amputation: no SKIN Rash: no Cyanosis: no Embolic phemonenon: no Bruising: no NEURO Alert and Oriented to person, place and time: yes Non focal: yes  PSYCH: Pt appears to have normal affect  LABS REVIEWED Last 3 CBC results: Lab Results  Component Value Date  WBC 6.7 05/13/2015  WBC 6.7 10/10/2014  WBC 6.8 03/12/2014   Lab Results  Component Value Date  HGB 12.9 05/13/2015  HGB 13.3 10/10/2014  HGB 13.6 03/12/2014   Lab Results  Component Value Date  HCT 38.7 05/13/2015  HCT 39.2 10/10/2014  HCT 40.0 03/12/2014   Lab Results  Component Value Date  PLT 265 05/13/2015  PLT 270 10/10/2014  PLT 277 03/12/2014   Lab Results  Component Value Date  CREATININE 0.9 05/13/2015  BUN 17 05/13/2015  NA 138 05/13/2015  K 4.4 05/13/2015  CL 103 05/13/2015  CO2 29.5 05/13/2015   No results found for: HGBA1C  Lab Results  Component Value Date  HDL 43.1 05/13/2015  HDL 43.8 10/10/2014  HDL 41.4 03/12/2014   Lab Results  Component Value Date  LDLCALC 112 05/13/2015  LDLCALC 139* 10/10/2014   Lab Results  Component Value Date  TRIG 344* 05/13/2015  TRIG 288* 10/10/2014  TRIG 413* 03/12/2014   Lab Results  Component Value Date  ALT 26 03/12/2014  AST 29 03/12/2014  ALKPHOS 59 03/12/2014   Diagnostic Studies Reviewed:  EKG EKG demonstrated normal sinus rhythm, nonspecific ST and T waves changes.  Assessment and Plan   74 y.o. female with  ICD-10-CM ICD-9-CM  1. Irregular tachycardia-tachycardia did occur during sleep study was wide complex insulin irregular. This may be atrial fibrillation with apparent conduction versus ventricular tachycardia. Echo showed normal LV function  making of ventricular tachycardia somewhat unlikely. Will need to evaluate for evidence of intermittent tachycardia other than during sleep study with a Holter monitor. Will place a 48 hour Holter monitor to evaluate this. R00.0 785.0 ECG 12-lead  48 Hr Holter Monitor  24/48 Hr Holter Monitor-Scan/Analysis ([facility])  24/48 Hr Holter Monitor-Interpretation  2. Hypercholesterolemia E78.0 272.0  3. Raynaud's disease without gangrene I73.00 443.0  4. Scleroderma M34.9 710.1  5. Pulmonary hypertension-will proceed with a right heart catheterization to further assess her pulmonary pressures to guide further therapy. Based on results of this will discuss with Pulmonary.  Return in about 4 weeks (around 07/02/2015).  These notes generated with voice recognition software. I apologize for typographical errors.  Sydnee Levans, MD

## 2015-06-07 NOTE — Discharge Summary (Signed)
  Pt with estimated rvsp of 45 mmHg  And scleroderma who presents for RHC to document right sided pressures to guide further therapy. Measurements obtained, FICK co completed. No immediate complications.

## 2015-06-10 ENCOUNTER — Encounter: Payer: Self-pay | Admitting: Cardiology

## 2015-07-17 ENCOUNTER — Other Ambulatory Visit: Payer: Self-pay | Admitting: Physician Assistant

## 2015-07-17 DIAGNOSIS — M2391 Unspecified internal derangement of right knee: Secondary | ICD-10-CM

## 2015-07-18 ENCOUNTER — Other Ambulatory Visit: Payer: Self-pay | Admitting: Physician Assistant

## 2015-07-18 DIAGNOSIS — M2391 Unspecified internal derangement of right knee: Secondary | ICD-10-CM

## 2015-07-18 DIAGNOSIS — M1711 Unilateral primary osteoarthritis, right knee: Secondary | ICD-10-CM

## 2015-07-25 ENCOUNTER — Ambulatory Visit
Admission: RE | Admit: 2015-07-25 | Discharge: 2015-07-25 | Disposition: A | Discharge status: Home / Self Care | Payer: Medicare Other | Source: Ambulatory Visit | Attending: Physician Assistant | Admitting: Physician Assistant

## 2015-07-25 DIAGNOSIS — M1711 Unilateral primary osteoarthritis, right knee: Secondary | ICD-10-CM | POA: Diagnosis present

## 2015-11-18 DIAGNOSIS — G609 Hereditary and idiopathic neuropathy, unspecified: Secondary | ICD-10-CM | POA: Insufficient documentation

## 2015-11-19 ENCOUNTER — Other Ambulatory Visit: Payer: Self-pay | Admitting: Family Medicine

## 2015-11-19 ENCOUNTER — Other Ambulatory Visit: Payer: Self-pay | Admitting: Oncology

## 2015-11-19 DIAGNOSIS — Z1231 Encounter for screening mammogram for malignant neoplasm of breast: Secondary | ICD-10-CM

## 2016-01-30 ENCOUNTER — Ambulatory Visit
Admission: RE | Admit: 2016-01-30 | Discharge: 2016-01-30 | Disposition: A | Discharge status: Home / Self Care | Payer: Medicare Other | Source: Ambulatory Visit | Attending: Family Medicine | Admitting: Family Medicine

## 2016-01-30 DIAGNOSIS — Z1231 Encounter for screening mammogram for malignant neoplasm of breast: Secondary | ICD-10-CM | POA: Insufficient documentation

## 2016-01-30 HISTORY — DX: Malignant neoplasm of unspecified site of unspecified female breast: C50.919

## 2016-02-12 ENCOUNTER — Encounter
Admission: RE | Admit: 2016-02-12 | Discharge: 2016-02-12 | Disposition: A | Discharge status: Home / Self Care | Payer: Medicare Other | Source: Ambulatory Visit | Attending: Orthopedic Surgery | Admitting: Orthopedic Surgery

## 2016-02-12 ENCOUNTER — Other Ambulatory Visit: Payer: Medicare Other

## 2016-02-12 DIAGNOSIS — E7800 Pure hypercholesterolemia, unspecified: Secondary | ICD-10-CM | POA: Diagnosis not present

## 2016-02-12 DIAGNOSIS — Z01812 Encounter for preprocedural laboratory examination: Secondary | ICD-10-CM | POA: Insufficient documentation

## 2016-02-12 DIAGNOSIS — Z01810 Encounter for preprocedural cardiovascular examination: Secondary | ICD-10-CM | POA: Insufficient documentation

## 2016-02-12 DIAGNOSIS — I272 Other secondary pulmonary hypertension: Secondary | ICD-10-CM | POA: Insufficient documentation

## 2016-02-12 HISTORY — DX: Raynaud's syndrome without gangrene: I73.00

## 2016-02-12 HISTORY — DX: Irritable bowel syndrome, unspecified: K58.9

## 2016-02-12 HISTORY — DX: Pure hypercholesterolemia, unspecified: E78.00

## 2016-02-12 HISTORY — DX: Pulmonary hypertension, unspecified: I27.20

## 2016-02-12 HISTORY — DX: Unspecified osteoarthritis, unspecified site: M19.90

## 2016-02-12 HISTORY — DX: Polyneuropathy, unspecified: G62.9

## 2016-02-12 HISTORY — DX: Adverse effect of unspecified anesthetic, initial encounter: T41.45XA

## 2016-02-12 HISTORY — DX: Systemic sclerosis, unspecified: M34.9

## 2016-02-12 HISTORY — DX: Other complications of anesthesia, initial encounter: T88.59XA

## 2016-02-12 LAB — TYPE AND SCREEN
ABO/RH(D): O POS
Antibody Screen: NEGATIVE

## 2016-02-12 LAB — APTT: aPTT: 40 seconds — ABNORMAL HIGH (ref 24–36)

## 2016-02-12 LAB — PROTIME-INR
INR: 0.89
PROTHROMBIN TIME: 12.3 s (ref 11.4–15.0)

## 2016-02-12 LAB — URINALYSIS COMPLETE WITH MICROSCOPIC (ARMC ONLY)
BILIRUBIN URINE: NEGATIVE
Glucose, UA: NEGATIVE mg/dL
HGB URINE DIPSTICK: NEGATIVE
Ketones, ur: NEGATIVE mg/dL
Leukocytes, UA: NEGATIVE
Nitrite: NEGATIVE
PH: 6 (ref 5.0–8.0)
PROTEIN: NEGATIVE mg/dL
SQUAMOUS EPITHELIAL / LPF: NONE SEEN
Specific Gravity, Urine: 1.009 (ref 1.005–1.030)

## 2016-02-12 LAB — COMPREHENSIVE METABOLIC PANEL
ALK PHOS: 55 U/L (ref 38–126)
ALT: 25 U/L (ref 14–54)
ANION GAP: 8 (ref 5–15)
AST: 31 U/L (ref 15–41)
Albumin: 4.5 g/dL (ref 3.5–5.0)
BUN: 23 mg/dL — ABNORMAL HIGH (ref 6–20)
CALCIUM: 10.2 mg/dL (ref 8.9–10.3)
CHLORIDE: 101 mmol/L (ref 101–111)
CO2: 27 mmol/L (ref 22–32)
Creatinine, Ser: 0.98 mg/dL (ref 0.44–1.00)
GFR, EST NON AFRICAN AMERICAN: 55 mL/min — AB (ref >60)
Glucose, Bld: 93 mg/dL (ref 65–99)
Potassium: 3.9 mmol/L (ref 3.5–5.1)
SODIUM: 136 mmol/L (ref 135–145)
Total Bilirubin: 0.6 mg/dL (ref 0.3–1.2)
Total Protein: 8 g/dL (ref 6.5–8.1)

## 2016-02-12 LAB — ABO/RH: ABO/RH(D): O POS

## 2016-02-12 LAB — MRSA PCR SCREENING: MRSA BY PCR: NEGATIVE

## 2016-02-12 LAB — SEDIMENTATION RATE: SED RATE: 30 mm/h (ref 0–30)

## 2016-02-12 LAB — CBC
HCT: 37.8 % (ref 35.0–47.0)
Hemoglobin: 13.1 g/dL (ref 12.0–16.0)
MCH: 32.4 pg (ref 26.0–34.0)
MCHC: 34.6 g/dL (ref 32.0–36.0)
MCV: 93.6 fL (ref 80.0–100.0)
PLATELETS: 278 10*3/uL (ref 150–440)
RBC: 4.03 MIL/uL (ref 3.80–5.20)
RDW: 13.1 % (ref 11.5–14.5)
WBC: 7.5 10*3/uL (ref 3.6–11.0)

## 2016-02-12 NOTE — Patient Instructions (Signed)
  Your procedure is scheduled on: 02/26/16 Wed Report to Day Surgery.2nd floor medical mall To find out your arrival time please call [phone] between 1PM - 3PM on 02/25/16 Tuesx.  Remember: Instructions that are not followed completely may result in serious medical risk, up to and including death, or upon the discretion of your surgeon and anesthesiologist your surgery may need to be rescheduled.    _x_ 1. Do not eat food or drink liquids after midnight. No gum chewing or hard candies.     ____ 2. No Alcohol for 24 hours before or after surgery.   ____ 3. Bring all medications with you on the day of surgery if instructed.    _x___ 4. Notify your doctor if there is any change in your medical condition     (cold, fever, infections).     Do not wear jewelry, make-up, hairpins, clips or nail polish.  Do not wear lotions, powders, or perfumes. You may wear deodorant.  Do not shave 48 hours prior to surgery. Men may shave face and neck.  Do not bring valuables to the hospital.    [facility] is not responsible for any belongings or valuables.               Contacts, dentures or bridgework may not be worn into surgery.  Leave your suitcase in the car. After surgery it may be brought to your room.  For patients admitted to the hospital, discharge time is determined by your                treatment team.   Patients discharged the day of surgery will not be allowed to drive home.   Please read over the following fact sheets that you were given:   MRSA Information   __x__ Take these medicines the morning of surgery with A SIP OF WATER:    1. fenofibrate micronized (LOFIBRA) 67 MG capsule  2. gabapentin (NEURONTIN) 100 MG capsule  3. NIFEdipine (PROCARDIA-XL/ADALAT CC) 30 MG 24 hr tablet  4.  5.  6.  ____ Fleet Enema (as directed)   _x___ Use CHG Soap as directed  ____ Use inhalers on the day of surgery  ____ Stop metformin 2 days prior to surgery    ____ Take 1/2 of usual  insulin dose the night before surgery and none on the morning of surgery.   ____ Stop Coumadin/Plavix/aspirin on   __x__ Stop Anti-inflammatories on stop vitamin e 1 week before surgery   ____ Stop supplements until after surgery.    ____ Bring C-Pap to the hospital.

## 2016-02-12 NOTE — Pre-Procedure Instructions (Signed)
Component Name Value Range  Vent Rate (bpm) 92   PR Interval (msec) 132   QRS Interval (msec) 98   QT Interval (msec) 366   QTc (msec) 452    Result Narrative  Normal sinus rhythm Possible Left atrial enlargement Incomplete right bundle branch block Borderline ECG No previous ECGs available I reviewed and concur with this report. Electronically signed [id number] MD, KEN [id number]) on 06/12/2015 8:03:24 AM   Status Results Details

## 2016-02-13 LAB — RPR: RPR Ser Ql: NONREACTIVE

## 2016-02-14 LAB — URINE CULTURE: Special Requests: NORMAL

## 2016-02-26 ENCOUNTER — Encounter: Payer: Self-pay | Admitting: *Deleted

## 2016-02-26 ENCOUNTER — Inpatient Hospital Stay: Payer: Medicare Other

## 2016-02-26 ENCOUNTER — Inpatient Hospital Stay: Payer: Medicare Other | Admitting: Anesthesiology

## 2016-02-26 ENCOUNTER — Encounter
Admission: RE | Disposition: A | Discharge status: Home Health | Payer: Self-pay | Source: Ambulatory Visit | Attending: Orthopedic Surgery

## 2016-02-26 ENCOUNTER — Inpatient Hospital Stay
Admission: RE | Admit: 2016-02-26 | Discharge: 2016-02-28 | DRG: 470 | Disposition: A | Discharge status: Home Health | Payer: Medicare Other | Source: Ambulatory Visit | Attending: Orthopedic Surgery | Admitting: Orthopedic Surgery

## 2016-02-26 DIAGNOSIS — Z79899 Other long term (current) drug therapy: Secondary | ICD-10-CM

## 2016-02-26 DIAGNOSIS — I272 Other secondary pulmonary hypertension: Secondary | ICD-10-CM | POA: Diagnosis present

## 2016-02-26 DIAGNOSIS — Z96659 Presence of unspecified artificial knee joint: Secondary | ICD-10-CM

## 2016-02-26 DIAGNOSIS — Z9011 Acquired absence of right breast and nipple: Secondary | ICD-10-CM

## 2016-02-26 DIAGNOSIS — M1711 Unilateral primary osteoarthritis, right knee: Secondary | ICD-10-CM | POA: Diagnosis present

## 2016-02-26 DIAGNOSIS — M19049 Primary osteoarthritis, unspecified hand: Secondary | ICD-10-CM | POA: Diagnosis present

## 2016-02-26 DIAGNOSIS — Z96651 Presence of right artificial knee joint: Secondary | ICD-10-CM

## 2016-02-26 DIAGNOSIS — E7800 Pure hypercholesterolemia, unspecified: Secondary | ICD-10-CM | POA: Diagnosis present

## 2016-02-26 DIAGNOSIS — Z6827 Body mass index (BMI) 27.0-27.9, adult: Secondary | ICD-10-CM | POA: Diagnosis not present

## 2016-02-26 DIAGNOSIS — R112 Nausea with vomiting, unspecified: Secondary | ICD-10-CM | POA: Diagnosis present

## 2016-02-26 DIAGNOSIS — I7300 Raynaud's syndrome without gangrene: Secondary | ICD-10-CM | POA: Diagnosis present

## 2016-02-26 DIAGNOSIS — M8580 Other specified disorders of bone density and structure, unspecified site: Secondary | ICD-10-CM | POA: Diagnosis present

## 2016-02-26 DIAGNOSIS — G629 Polyneuropathy, unspecified: Secondary | ICD-10-CM | POA: Diagnosis present

## 2016-02-26 DIAGNOSIS — Z853 Personal history of malignant neoplasm of breast: Secondary | ICD-10-CM | POA: Diagnosis not present

## 2016-02-26 DIAGNOSIS — Z886 Allergy status to analgesic agent status: Secondary | ICD-10-CM | POA: Diagnosis not present

## 2016-02-26 DIAGNOSIS — Z885 Allergy status to narcotic agent status: Secondary | ICD-10-CM | POA: Diagnosis not present

## 2016-02-26 DIAGNOSIS — Z9221 Personal history of antineoplastic chemotherapy: Secondary | ICD-10-CM

## 2016-02-26 DIAGNOSIS — M349 Systemic sclerosis, unspecified: Secondary | ICD-10-CM | POA: Diagnosis present

## 2016-02-26 HISTORY — PX: KNEE ARTHROPLASTY: SHX992

## 2016-02-26 HISTORY — DX: Deviated nasal septum: J34.2

## 2016-02-26 SURGERY — ARTHROPLASTY, KNEE, TOTAL, USING IMAGELESS COMPUTER-ASSISTED NAVIGATION
Anesthesia: Spinal | Site: Knee | Laterality: Right | Wound class: Clean

## 2016-02-26 MED ORDER — CEFAZOLIN SODIUM-DEXTROSE 2-4 GM/100ML-% IV SOLN
2.0000 g | INTRAVENOUS | Status: AC
  Administered 2016-02-26: 2 g via INTRAVENOUS

## 2016-02-26 MED ORDER — TRANEXAMIC ACID 1000 MG/10ML IV SOLN
1000.0000 mg | Freq: Once | INTRAVENOUS | Status: AC
  Administered 2016-02-26: 1000 mg via INTRAVENOUS
  Filled 2016-02-26: qty 10

## 2016-02-26 MED ORDER — FENOFIBRATE 54 MG PO TABS
54.0000 mg | ORAL_TABLET | Freq: Every day | ORAL | Status: DC
Start: 2016-02-27 — End: 2016-02-28
  Administered 2016-02-28: 54 mg via ORAL
  Filled 2016-02-26: qty 1

## 2016-02-26 MED ORDER — FENTANYL CITRATE (PF) 100 MCG/2ML IJ SOLN
INTRAMUSCULAR | Status: AC
  Administered 2016-02-26: 25 ug via INTRAVENOUS
  Filled 2016-02-26: qty 2

## 2016-02-26 MED ORDER — SODIUM CHLORIDE 0.9 % IV SOLN
1000.0000 mg | INTRAVENOUS | Status: DC
  Filled 2016-02-26: qty 10

## 2016-02-26 MED ORDER — FAMOTIDINE 20 MG PO TABS
20.0000 mg | ORAL_TABLET | Freq: Once | ORAL | Status: AC
  Administered 2016-02-26: 20 mg via ORAL

## 2016-02-26 MED ORDER — MORPHINE SULFATE (PF) 2 MG/ML IV SOLN
2.0000 mg | INTRAVENOUS | Status: DC | PRN
  Administered 2016-02-26: 2 mg via INTRAVENOUS
  Filled 2016-02-26: qty 1

## 2016-02-26 MED ORDER — OXYCODONE HCL 5 MG PO TABS
5.0000 mg | ORAL_TABLET | ORAL | Status: DC | PRN
  Administered 2016-02-26 – 2016-02-27 (×2): 5 mg via ORAL
  Filled 2016-02-26 (×2): qty 1

## 2016-02-26 MED ORDER — MIDAZOLAM HCL 5 MG/5ML IJ SOLN
INTRAMUSCULAR | Status: DC | PRN
  Administered 2016-02-26: 2 mg via INTRAVENOUS

## 2016-02-26 MED ORDER — ESCITALOPRAM OXALATE 10 MG PO TABS
10.0000 mg | ORAL_TABLET | Freq: Every day | ORAL | Status: DC
  Administered 2016-02-27: 10 mg via ORAL
  Filled 2016-02-26 (×2): qty 1

## 2016-02-26 MED ORDER — DIPHENHYDRAMINE HCL 12.5 MG/5ML PO ELIX: 12.5000 mg | ORAL_SOLUTION | ORAL | Status: DC | PRN

## 2016-02-26 MED ORDER — PROPOFOL 500 MG/50ML IV EMUL
INTRAVENOUS | Status: DC | PRN
  Administered 2016-02-26: 25 ug/kg/min via INTRAVENOUS

## 2016-02-26 MED ORDER — ACETAMINOPHEN 10 MG/ML IV SOLN
INTRAVENOUS | Status: DC | PRN
  Administered 2016-02-26: 1000 mg via INTRAVENOUS

## 2016-02-26 MED ORDER — FLEET ENEMA 7-19 GM/118ML RE ENEM: 1.0000 | ENEMA | Freq: Once | RECTAL | Status: DC | PRN

## 2016-02-26 MED ORDER — TRANEXAMIC ACID 1000 MG/10ML IV SOLN
1000.0000 mg | INTRAVENOUS | Status: DC | PRN
  Administered 2016-02-26: 1000 mg via INTRAVENOUS

## 2016-02-26 MED ORDER — ACETAMINOPHEN 650 MG RE SUPP: 650.0000 mg | Freq: Four times a day (QID) | RECTAL | Status: DC | PRN

## 2016-02-26 MED ORDER — PANTOPRAZOLE SODIUM 40 MG PO TBEC
40.0000 mg | DELAYED_RELEASE_TABLET | Freq: Two times a day (BID) | ORAL | Status: DC
  Administered 2016-02-27 – 2016-02-28 (×2): 40 mg via ORAL
  Filled 2016-02-26 (×3): qty 1

## 2016-02-26 MED ORDER — LORATADINE 10 MG PO TABS
10.0000 mg | ORAL_TABLET | Freq: Every day | ORAL | Status: DC
  Administered 2016-02-27 – 2016-02-28 (×2): 10 mg via ORAL
  Filled 2016-02-26 (×2): qty 1

## 2016-02-26 MED ORDER — ONDANSETRON HCL 4 MG/2ML IJ SOLN
4.0000 mg | Freq: Four times a day (QID) | INTRAMUSCULAR | Status: DC | PRN
  Administered 2016-02-26 – 2016-02-27 (×3): 4 mg via INTRAVENOUS
  Filled 2016-02-26 (×3): qty 2

## 2016-02-26 MED ORDER — TETRACAINE HCL 1 % IJ SOLN
INTRAMUSCULAR | Status: AC
  Filled 2016-02-26: qty 2

## 2016-02-26 MED ORDER — PHENOL 1.4 % MT LIQD
1.0000 | OROMUCOSAL | Status: DC | PRN
Start: 2016-02-26 — End: 2016-02-28
  Filled 2016-02-26: qty 177

## 2016-02-26 MED ORDER — SODIUM CHLORIDE 0.9 % IV SOLN
INTRAVENOUS | Status: DC
  Administered 2016-02-26 – 2016-02-27 (×2): via INTRAVENOUS

## 2016-02-26 MED ORDER — ENOXAPARIN SODIUM 30 MG/0.3ML ~~LOC~~ SOLN
30.0000 mg | Freq: Two times a day (BID) | SUBCUTANEOUS | Status: DC
  Administered 2016-02-27 – 2016-02-28 (×3): 30 mg via SUBCUTANEOUS
  Filled 2016-02-26 (×3): qty 0.3

## 2016-02-26 MED ORDER — CELECOXIB 200 MG PO CAPS: 200.0000 mg | ORAL_CAPSULE | Freq: Two times a day (BID) | ORAL | Status: DC

## 2016-02-26 MED ORDER — BUPIVACAINE-EPINEPHRINE 0.25% -1:200000 IJ SOLN
INTRAMUSCULAR | Status: DC | PRN
Start: 2016-02-26 — End: 2016-02-26
  Administered 2016-02-26: 30 mL

## 2016-02-26 MED ORDER — NEOMYCIN-POLYMYXIN B GU 40-200000 IR SOLN
Status: DC | PRN
  Administered 2016-02-26: 14 mL

## 2016-02-26 MED ORDER — SODIUM CHLORIDE FLUSH 0.9 % IV SOLN
INTRAVENOUS | Status: AC
  Filled 2016-02-26: qty 10

## 2016-02-26 MED ORDER — ACETAMINOPHEN 10 MG/ML IV SOLN
1000.0000 mg | Freq: Four times a day (QID) | INTRAVENOUS | Status: AC
  Administered 2016-02-26 – 2016-02-27 (×4): 1000 mg via INTRAVENOUS
  Filled 2016-02-26 (×4): qty 100

## 2016-02-26 MED ORDER — MAGNESIUM HYDROXIDE 400 MG/5ML PO SUSP
30.0000 mL | Freq: Every day | ORAL | Status: DC | PRN
  Administered 2016-02-27: 30 mL via ORAL
  Filled 2016-02-26: qty 30

## 2016-02-26 MED ORDER — FENTANYL CITRATE (PF) 100 MCG/2ML IJ SOLN
25.0000 ug | INTRAMUSCULAR | Status: AC | PRN
  Administered 2016-02-26 (×6): 25 ug via INTRAVENOUS

## 2016-02-26 MED ORDER — ONDANSETRON HCL 4 MG PO TABS: 4.0000 mg | ORAL_TABLET | Freq: Four times a day (QID) | ORAL | Status: DC | PRN

## 2016-02-26 MED ORDER — ALUM & MAG HYDROXIDE-SIMETH 200-200-20 MG/5ML PO SUSP: 30.0000 mL | ORAL | Status: DC | PRN

## 2016-02-26 MED ORDER — FERROUS SULFATE 325 (65 FE) MG PO TABS
325.0000 mg | ORAL_TABLET | Freq: Two times a day (BID) | ORAL | Status: DC
  Administered 2016-02-28: 325 mg via ORAL
  Filled 2016-02-26: qty 1

## 2016-02-26 MED ORDER — BUPIVACAINE HCL (PF) 0.5 % IJ SOLN
INTRAMUSCULAR | Status: DC | PRN
  Administered 2016-02-26: 2 mL

## 2016-02-26 MED ORDER — ONDANSETRON HCL 4 MG/2ML IJ SOLN
INTRAMUSCULAR | Status: DC | PRN
  Administered 2016-02-26: 4 mg via INTRAVENOUS

## 2016-02-26 MED ORDER — ADULT MULTIVITAMIN W/MINERALS CH
ORAL_TABLET | Freq: Every morning | ORAL | Status: DC
  Administered 2016-02-28: 1 via ORAL
  Filled 2016-02-26: qty 1

## 2016-02-26 MED ORDER — CHLORHEXIDINE GLUCONATE 4 % EX LIQD: 60.0000 mL | Freq: Once | CUTANEOUS | Status: DC

## 2016-02-26 MED ORDER — RISAQUAD PO CAPS
ORAL_CAPSULE | Freq: Every day | ORAL | Status: DC
  Administered 2016-02-27: 1 via ORAL
  Filled 2016-02-26 (×2): qty 1

## 2016-02-26 MED ORDER — VITAMIN E 180 MG (400 UNIT) PO CAPS
400.0000 [IU] | ORAL_CAPSULE | Freq: Every day | ORAL | Status: DC
  Administered 2016-02-28: 400 [IU] via ORAL
  Filled 2016-02-26: qty 1

## 2016-02-26 MED ORDER — VITAMIN B-12 1000 MCG PO TABS
2500.0000 ug | ORAL_TABLET | Freq: Every morning | ORAL | Status: DC
  Administered 2016-02-28: 2500 ug via ORAL
  Filled 2016-02-26: qty 3

## 2016-02-26 MED ORDER — ATORVASTATIN CALCIUM 20 MG PO TABS
40.0000 mg | ORAL_TABLET | Freq: Every day | ORAL | Status: DC
  Administered 2016-02-27: 40 mg via ORAL
  Filled 2016-02-26: qty 2

## 2016-02-26 MED ORDER — FAMOTIDINE 20 MG PO TABS
ORAL_TABLET | ORAL | Status: AC
  Administered 2016-02-26: 20 mg via ORAL
  Filled 2016-02-26: qty 1

## 2016-02-26 MED ORDER — FENTANYL CITRATE (PF) 100 MCG/2ML IJ SOLN
INTRAMUSCULAR | Status: AC
  Filled 2016-02-26: qty 2

## 2016-02-26 MED ORDER — TRAMADOL HCL 50 MG PO TABS
50.0000 mg | ORAL_TABLET | ORAL | Status: DC | PRN
  Administered 2016-02-28: 50 mg via ORAL
  Filled 2016-02-26: qty 1

## 2016-02-26 MED ORDER — CEFAZOLIN SODIUM-DEXTROSE 2-4 GM/100ML-% IV SOLN
2.0000 g | Freq: Four times a day (QID) | INTRAVENOUS | Status: AC
  Administered 2016-02-26 – 2016-02-27 (×4): 2 g via INTRAVENOUS
  Filled 2016-02-26 (×4): qty 100

## 2016-02-26 MED ORDER — LACTATED RINGERS IV SOLN
INTRAVENOUS | Status: DC
  Administered 2016-02-26 (×2): via INTRAVENOUS

## 2016-02-26 MED ORDER — NIFEDIPINE ER 30 MG PO TB24
30.0000 mg | ORAL_TABLET | Freq: Every day | ORAL | Status: DC
  Administered 2016-02-27 – 2016-02-28 (×2): 30 mg via ORAL
  Filled 2016-02-26 (×4): qty 1

## 2016-02-26 MED ORDER — METOCLOPRAMIDE HCL 10 MG PO TABS
10.0000 mg | ORAL_TABLET | Freq: Three times a day (TID) | ORAL | Status: DC
  Filled 2016-02-26: qty 1

## 2016-02-26 MED ORDER — SODIUM CHLORIDE 0.9 % IV SOLN
INTRAVENOUS | Status: DC | PRN
  Administered 2016-02-26: 60 mL

## 2016-02-26 MED ORDER — VITAMIN D 1000 UNITS PO TABS
1000.0000 [IU] | ORAL_TABLET | Freq: Every day | ORAL | Status: DC
  Administered 2016-02-28: 1000 [IU] via ORAL
  Filled 2016-02-26 (×3): qty 1

## 2016-02-26 MED ORDER — CEFAZOLIN SODIUM-DEXTROSE 2-4 GM/100ML-% IV SOLN
INTRAVENOUS | Status: AC
  Filled 2016-02-26: qty 100

## 2016-02-26 MED ORDER — GABAPENTIN 100 MG PO CAPS
100.0000 mg | ORAL_CAPSULE | Freq: Three times a day (TID) | ORAL | Status: DC
  Administered 2016-02-27 – 2016-02-28 (×3): 100 mg via ORAL
  Filled 2016-02-26 (×4): qty 1

## 2016-02-26 MED ORDER — SENNOSIDES-DOCUSATE SODIUM 8.6-50 MG PO TABS
1.0000 | ORAL_TABLET | Freq: Two times a day (BID) | ORAL | Status: DC
  Administered 2016-02-27 – 2016-02-28 (×3): 1 via ORAL
  Filled 2016-02-26 (×4): qty 1

## 2016-02-26 MED ORDER — ONDANSETRON HCL 4 MG/2ML IJ SOLN: 4.0000 mg | Freq: Once | INTRAMUSCULAR | Status: DC | PRN

## 2016-02-26 MED ORDER — TETRACAINE HCL 1 % IJ SOLN
INTRAMUSCULAR | Status: DC | PRN
  Administered 2016-02-26: 10 mg via INTRASPINAL

## 2016-02-26 MED ORDER — MENTHOL 3 MG MT LOZG
1.0000 | LOZENGE | OROMUCOSAL | Status: DC | PRN
  Filled 2016-02-26: qty 9

## 2016-02-26 MED ORDER — ACETAMINOPHEN 325 MG PO TABS: 650.0000 mg | ORAL_TABLET | Freq: Four times a day (QID) | ORAL | Status: DC | PRN

## 2016-02-26 MED ORDER — BISACODYL 10 MG RE SUPP: 10.0000 mg | Freq: Every day | RECTAL | Status: DC | PRN

## 2016-02-26 MED ORDER — CALCIUM CARBONATE ANTACID 500 MG PO CHEW
1500.0000 mg | CHEWABLE_TABLET | Freq: Two times a day (BID) | ORAL | Status: DC
  Administered 2016-02-27 – 2016-02-28 (×2): 1500 mg via ORAL
  Filled 2016-02-26 (×2): qty 3

## 2016-02-26 MED ORDER — PHENYLEPHRINE HCL 10 MG/ML IJ SOLN
INTRAMUSCULAR | Status: DC | PRN
  Administered 2016-02-26 (×3): 100 ug via INTRAVENOUS

## 2016-02-26 MED ORDER — CETYLPYRIDINIUM CHLORIDE 0.05 % MT LIQD
7.0000 mL | Freq: Two times a day (BID) | OROMUCOSAL | Status: DC
  Administered 2016-02-27: 7 mL via OROMUCOSAL

## 2016-02-26 SURGICAL SUPPLY — 56 items
AUTOTRANSFUS HAS 1/8 (MISCELLANEOUS) ×3
BATTERY INSTRU NAVIGATION (MISCELLANEOUS) ×12 IMPLANT
BLADE SAW 1 (BLADE) ×3 IMPLANT
BLADE SAW 1/2 (BLADE) ×3 IMPLANT
CANISTER SUCT 1200ML W/VALVE (MISCELLANEOUS) ×3 IMPLANT
CANISTER SUCT 3000ML (MISCELLANEOUS) ×6 IMPLANT
CAPT KNEE TOTAL 3 ATTUNE ×3 IMPLANT
CATH TRAY METER 16FR LF (MISCELLANEOUS) ×3 IMPLANT
CEMENT HV SMART SET (Cement) ×6 IMPLANT
COOLER POLAR GLACIER W/PUMP (MISCELLANEOUS) ×3 IMPLANT
DRAPE SHEET LG 3/4 BI-LAMINATE (DRAPES) ×3 IMPLANT
DRSG DERMACEA 8X12 NADH (GAUZE/BANDAGES/DRESSINGS) ×3 IMPLANT
DRSG OPSITE POSTOP 4X14 (GAUZE/BANDAGES/DRESSINGS) ×3 IMPLANT
DRSG TEGADERM 4X4.75 (GAUZE/BANDAGES/DRESSINGS) ×3 IMPLANT
DURAPREP 26ML APPLICATOR (WOUND CARE) ×6 IMPLANT
ELECT CAUTERY BLADE 6.4 (BLADE) ×3 IMPLANT
ELECT REM PT RETURN 9FT ADLT (ELECTROSURGICAL) ×3
ELECTRODE REM PT RTRN 9FT ADLT (ELECTROSURGICAL) ×1 IMPLANT
EX-PIN ORTHOLOCK NAV 4X150 (PIN) ×6 IMPLANT
GLOVE BIOGEL M STRL SZ7.5 (GLOVE) ×6 IMPLANT
GLOVE INDICATOR 8.0 STRL GRN (GLOVE) ×3 IMPLANT
GLOVE SURG 9.0 ORTHO LTXF (GLOVE) ×3 IMPLANT
GLOVE SURG ORTHO 9.0 STRL STRW (GLOVE) ×3 IMPLANT
GOWN STRL REUS W/ TWL LRG LVL3 (GOWN DISPOSABLE) ×2 IMPLANT
GOWN STRL REUS W/TWL 2XL LVL3 (GOWN DISPOSABLE) ×3 IMPLANT
GOWN STRL REUS W/TWL LRG LVL3 (GOWN DISPOSABLE) ×4
HANDPIECE SUCTION TUBG SURGILV (MISCELLANEOUS) ×3 IMPLANT
HOLDER FOLEY CATH W/STRAP (MISCELLANEOUS) ×3 IMPLANT
HOOD PEEL AWAY FLYTE STAYCOOL (MISCELLANEOUS) ×6 IMPLANT
KIT RM TURNOVER STRD PROC AR (KITS) ×3 IMPLANT
KNIFE SCULPS 14X20 (INSTRUMENTS) ×3 IMPLANT
NDL SAFETY 18GX1.5 (NEEDLE) ×3 IMPLANT
NEEDLE SPNL 20GX3.5 QUINCKE YW (NEEDLE) ×3 IMPLANT
NS IRRIG 500ML POUR BTL (IV SOLUTION) ×3 IMPLANT
PACK TOTAL KNEE (MISCELLANEOUS) ×3 IMPLANT
PAD WRAPON POLAR KNEE (MISCELLANEOUS) ×1 IMPLANT
PIN DRILL QUICK PACK ×3 IMPLANT
PIN FIXATION 1/8DIA X 3INL (PIN) ×3 IMPLANT
SOL .9 NS 3000ML IRR  AL (IV SOLUTION) ×2
SOL .9 NS 3000ML IRR UROMATIC (IV SOLUTION) ×1 IMPLANT
SOL PREP PVP 2OZ (MISCELLANEOUS) ×3
SOLUTION PREP PVP 2OZ (MISCELLANEOUS) ×1 IMPLANT
SPONGE DRAIN TRACH 4X4 STRL 2S (GAUZE/BANDAGES/DRESSINGS) ×3 IMPLANT
STAPLER SKIN PROX 35W (STAPLE) ×3 IMPLANT
SUCTION FRAZIER HANDLE 10FR (MISCELLANEOUS) ×2
SUCTION TUBE FRAZIER 10FR DISP (MISCELLANEOUS) ×1 IMPLANT
SUT VIC AB 0 CT1 36 (SUTURE) ×3 IMPLANT
SUT VIC AB 1 CT1 36 (SUTURE) ×6 IMPLANT
SUT VIC AB 2-0 CT2 27 (SUTURE) ×3 IMPLANT
SYR 20CC LL (SYRINGE) ×3 IMPLANT
SYR 30ML LL (SYRINGE) ×3 IMPLANT
SYR 50ML LL SCALE MARK (SYRINGE) ×3 IMPLANT
SYSTEM AUTOTRANSFUS DUAL TROCR (MISCELLANEOUS) ×1 IMPLANT
TOWEL OR 17X26 4PK STRL BLUE (TOWEL DISPOSABLE) ×3 IMPLANT
TOWER CARTRIDGE SMART MIX (DISPOSABLE) ×3 IMPLANT
WRAPON POLAR PAD KNEE (MISCELLANEOUS) ×3

## 2016-02-26 NOTE — H&P (Signed)
The patient has been re-examined, and the chart reviewed, and there have been no interval changes to the documented history and physical.    The risks, benefits, and alternatives have been discussed at length. The patient expressed understanding of the risks benefits and agreed with plans for surgical intervention.  James P. Hooten, Jr. M.D.    

## 2016-02-26 NOTE — NC FL2 (Signed)
[place] LEVEL OF CARE SCREENING TOOL     IDENTIFICATION  Patient Name: Veronica Hoffman Birthdate: May 10, 1941 Sex: female Admission Date (Current Location): 02/26/2016  [place] and [place] Number:  Engineering geologist and Address:  [facility], [address] [place], [place] [address]      Provider Number: [id number]  Attending Physician Name and Address:  Dereck Leep, MD  Relative Name and Phone Number:       Current Level of Care: Hospital Recommended Level of Care: [place] Prior Approval Number:    Date Approved/Denied:   PASRR Number:  ([id number])  Discharge Plan: SNF    Current Diagnoses: Patient Active Problem List   Diagnosis Date Noted  . S/P total knee arthroplasty 02/26/2016  . Paroxysmal digital cyanosis 06/07/2015  . Pulmonary hypertension ([place]) 06/07/2015   Raynaud's disease    Hyperlipidemia, unspecified    Insomnia    Depression, unspecified    Osteoarthritis of both knees    Osteoarthritis of hand    Scleroderma (CMS-HCC)  Positive FANA and anti Scl 70. Positive family history. Mildly elevated RVSP. Raynaud's.  Cancer (CMS-HCC)  Breast   Osteopenia    Lichen sclerosus    Chickenpox    Cataracts, bilateral       Orientation RESPIRATION BLADDER Height & Weight     Self, Time, Situation, Place  O2 (2 Liters Oxygen ) Continent Weight: 160 lb (72.576 kg) Height:  5' 7.5" (171.5 cm)  BEHAVIORAL SYMPTOMS/MOOD NEUROLOGICAL BOWEL NUTRITION STATUS   (none )  (none ) Continent Diet (Diet: Clear Liquid )  AMBULATORY STATUS COMMUNICATION OF NEEDS Skin   Extensive Assist Verbally Surgical wounds (Incision: Right Knee )                       Personal Care Assistance Level of Assistance  Bathing, Feeding, Dressing Bathing Assistance: Limited assistance Feeding assistance: Independent Dressing Assistance: Limited assistance     Functional Limitations Info   Sight, Hearing, Speech Sight Info: Adequate Hearing Info: Adequate Speech Info: Adequate    SPECIAL CARE FACTORS FREQUENCY  PT (By licensed PT), OT (By licensed OT)     PT Frequency:  (5) OT Frequency:  (5)            Contractures      Additional Factors Info  Code Status, Allergies Code Status Info:  (Not on File. ) Allergies Info:  (Aspirin, Boniva, Codeine, Demerol, Fosamax, Norvasc, Propoxyphene)           Current Medications (02/26/2016):  This is the current hospital active medication list Current Facility-Administered Medications  Medication Dose Route Frequency Provider Last Rate Last Dose  . 0.9 %  sodium chloride infusion   Intravenous Continuous Dereck Leep, MD      . acetaminophen (OFIRMEV) IV 1,000 mg  1,000 mg Intravenous Q6H Dereck Leep, MD      . acetaminophen (TYLENOL) tablet 650 mg  650 mg Oral Q6H PRN Dereck Leep, MD       Or  . acetaminophen (TYLENOL) suppository 650 mg  650 mg Rectal Q6H PRN Dereck Leep, MD      . acidophilus (RISAQUAD) capsule   Oral QHS Dereck Leep, MD      . alum & mag hydroxide-simeth (MAALOX/MYLANTA) 200-200-20 MG/5ML suspension 30 mL  30 mL Oral Q4H PRN Dereck Leep, MD      . atorvastatin (LIPITOR) tablet 40 mg  40 mg Oral q1800 Dereck Leep, MD      . bisacodyl (DULCOLAX) suppository 10 mg  10 mg Rectal Daily PRN Dereck Leep, MD      . calcium carbonate (TUMS - dosed in mg elemental calcium) chewable tablet 1,500 mg  1,500 mg Oral BID WC Dereck Leep, MD      . ceFAZolin (ANCEF) 2-4 GM/100ML-% IVPB           . ceFAZolin (ANCEF) IVPB 2g/100 mL premix  2 g Intravenous Q6H Dereck Leep, MD      . cholecalciferol (VITAMIN D) tablet 1,000 Units  1,000 Units Oral Daily Dereck Leep, MD      . diphenhydrAMINE (BENADRYL) 12.5 MG/5ML elixir 12.5-25 mg  12.5-25 mg Oral Q4H PRN Dereck Leep, MD      . Derrill Memo ON 02/27/2016] enoxaparin (LOVENOX) injection 30 mg  30 mg Subcutaneous Q12H Dereck Leep, MD       . escitalopram (LEXAPRO) tablet 10 mg  10 mg Oral QHS Dereck Leep, MD      . Derrill Memo ON 02/27/2016] fenofibrate tablet 54 mg  54 mg Oral Daily Dereck Leep, MD      . fentaNYL (SUBLIMAZE) 100 MCG/2ML injection           . ferrous sulfate tablet 325 mg  325 mg Oral BID WC Dereck Leep, MD      . gabapentin (NEURONTIN) capsule 100 mg  100 mg Oral TID Dereck Leep, MD      . loratadine (CLARITIN) tablet 10 mg  10 mg Oral Daily Dereck Leep, MD      . magnesium hydroxide (MILK OF MAGNESIA) suspension 30 mL  30 mL Oral Daily PRN Dereck Leep, MD      . menthol-cetylpyridinium (CEPACOL) lozenge 3 mg  1 lozenge Oral PRN Dereck Leep, MD       Or  . phenol (CHLORASEPTIC) mouth spray 1 spray  1 spray Mouth/Throat PRN Dereck Leep, MD      . metoCLOPramide (REGLAN) tablet 10 mg  10 mg Oral TID AC & HS Dereck Leep, MD      . morphine 2 MG/ML injection 2 mg  2 mg Intravenous Q2H PRN Dereck Leep, MD   2 mg at 02/26/16 1313  . multivitamin with minerals tablet   Oral q morning - 10a Dereck Leep, MD      . Derrill Memo ON 02/27/2016] NIFEdipine (PROCARDIA-XL/ADALAT CC) 24 hr tablet 30 mg  30 mg Oral Daily Dereck Leep, MD      . ondansetron (ZOFRAN) tablet 4 mg  4 mg Oral Q6H PRN Dereck Leep, MD       Or  . ondansetron (ZOFRAN) injection 4 mg  4 mg Intravenous Q6H PRN Dereck Leep, MD   4 mg at 02/26/16 1320  . oxyCODONE (Oxy IR/ROXICODONE) immediate release tablet 5-10 mg  5-10 mg Oral Q4H PRN Dereck Leep, MD      . pantoprazole (PROTONIX) EC tablet 40 mg  40 mg Oral BID Dereck Leep, MD      . senna-docusate (Senokot-S) tablet 1 tablet  1 tablet Oral BID Dereck Leep, MD      . sodium chloride flush 0.9 % injection           . sodium phosphate (FLEET) 7-19 GM/118ML enema 1 enema  1 enema Rectal Once PRN Dereck Leep, MD      .  traMADol (ULTRAM) tablet 50-100 mg  50-100 mg Oral Q4H PRN Dereck Leep, MD      . vitamin B-12 (CYANOCOBALAMIN) tablet 2,500 mcg  2,500 mcg Oral  q morning - 10a Dereck Leep, MD      . vitamin E capsule 400 Units  400 Units Oral Daily Dereck Leep, MD         Discharge Medications: Please see discharge summary for a list of discharge medications.  Relevant Imaging Results:  Relevant Lab Results:   Additional Information  (SSN: [id number])  Loralyn Freshwater, LCSW

## 2016-02-26 NOTE — Op Note (Signed)
OPERATIVE NOTE  DATE OF SURGERY:  02/26/2016  PATIENT NAME:  Veronica Hoffman   DOB: [birth year]  MRN: [id number]  PRE-OPERATIVE DIAGNOSIS: Degenerative arthrosis of the right knee, primary  POST-OPERATIVE DIAGNOSIS:  Same  PROCEDURE:  Right total knee arthroplasty using computer-assisted navigation  SURGEON:  Marciano Sequin. M.D.  ASSISTANT:  Vance Peper, PA (present and scrubbed throughout the case, critical for assistance with exposure, retraction, instrumentation, and closure)  ANESTHESIA: spinal  ESTIMATED BLOOD LOSS: 50 mL  FLUIDS REPLACED: 1200 mL of crystalloid  TOURNIQUET TIME: 113 minutes  DRAINS: 2 medium drains to a reinfusion system  SOFT TISSUE RELEASES: Anterior cruciate ligament, posterior cruciate ligament, deep medial collateral ligament, patellofemoral ligament   IMPLANTS UTILIZED: DePuy Attune size 6 narrow posterior stabilized femoral component (cemented), size 4 rotating platform tibial component (cemented), 32 mm medialized dome patella (cemented), and a 5 mm stabilized rotating platform polyethylene insert.  INDICATIONS FOR SURGERY: Veronica Hoffman is a 75 y.o. year old female with a long history of progressive knee pain. X-rays demonstrated severe degenerative changes in tricompartmental fashion. The patient had not seen any significant improvement despite conservative nonsurgical intervention. After discussion of the risks and benefits of surgical intervention, the patient expressed understanding of the risks benefits and agree with plans for total knee arthroplasty.   The risks, benefits, and alternatives were discussed at length including but not limited to the risks of infection, bleeding, nerve injury, stiffness, blood clots, the need for revision surgery, cardiopulmonary complications, among others, and they were willing to proceed.  PROCEDURE IN DETAIL: The patient was brought into the operating room and, after adequate spinal anesthesia was achieved, a  tourniquet was placed on the patient's upper thigh. The patient's knee and leg were cleaned and prepped with alcohol and DuraPrep and draped in the usual sterile fashion. A "timeout" was performed as per usual protocol. The lower extremity was exsanguinated using an Esmarch, and the tourniquet was inflated to 300 mmHg. An anterior longitudinal incision was made followed by a standard mid vastus approach. The deep fibers of the medial collateral ligament were elevated in a subperiosteal fashion off of the medial flare of the tibia so as to maintain a continuous soft tissue sleeve. The patella was subluxed laterally and the patellofemoral ligament was incised. Inspection of the knee demonstrated severe degenerative changes with full-thickness loss of articular cartilage. Osteophytes were debrided using a rongeur. Anterior and posterior cruciate ligaments were excised. Two 4.0 mm Schanz pins were inserted in the femur and into the tibia for attachment of the array of trackers used for computer-assisted navigation. Hip center was identified using a circumduction technique. Distal landmarks were mapped using the computer. The distal femur and proximal tibia were mapped using the computer. The distal femoral cutting guide was positioned using computer-assisted navigation so as to achieve a 5 distal valgus cut. The femur was sized and it was felt that a size 6 narrow femoral component was appropriate. A size 6 femoral cutting guide was positioned and the anterior cut was performed and verified using the computer. This was followed by completion of the posterior and chamfer cuts. Femoral cutting guide for the central box was then positioned in the center box cut was performed.  Attention was then directed to the proximal tibia. Medial and lateral menisci were excised. The extramedullary tibial cutting guide was positioned using computer-assisted navigation so as to achieve a 0 varus-valgus alignment and 3 posterior  slope. The cut was performed and verified  using the computer. The proximal tibia was sized and it was felt that a size 4 tibial tray was appropriate. Tibial and femoral trials were inserted followed by insertion of a 5 mm polyethylene insert. The knee was tight both in flexion and extension. The trial components removed and an additional 2 mm of bone was resected from the proximal tibia using the extramedullary tibial cutting guide. Trial components were reinserted. This allowed for excellent mediolateral soft tissue balancing both in flexion and in full extension. Finally, the patella was cut and prepared so as to accommodate a 32 mm medialized dome patella. A patella trial was placed and the knee was placed through a range of motion with excellent patellar tracking appreciated. The femoral trial was removed after debridement of posterior osteophytes. The central post-hole for the tibial component was reamed followed by insertion of a keel punch. Tibial trials were then removed. Cut surfaces of bone were irrigated with copious amounts of normal saline with antibiotic solution using pulsatile lavage and then suctioned dry. Polymethylmethacrylate cement was prepared in the usual fashion using a vacuum mixer. Cement was applied to the cut surface of the proximal tibia as well as along the undersurface of a size 4 rotating platform tibial component. Tibial component was positioned and impacted into place. Excess cement was removed using [employer] fast streamer. Cement was then applied to the cut surfaces of the femur as well as along the posterior flanges of the size 6 narrow femoral component. The femoral component was positioned and impacted into place. Excess cement was removed using [employer] fast streamer. A 5 mm polyethylene trial was inserted and the knee was brought into full extension with steady axial compression applied. Finally, cement was applied to the backside of a 32 mm medialized dome patella and the patellar  component was positioned and patellar clamp applied. Excess cement was removed using [employer] fast streamer. After adequate curing of the cement, the tourniquet was deflated after a total tourniquet time of 113 minutes. Hemostasis was achieved using electrocautery. The knee was irrigated with copious amounts of normal saline with antibiotic solution using pulsatile lavage and then suctioned dry. 20 mL of 1.3% Exparel in 40 mL of normal saline was injected along the posterior capsule, medial and lateral gutters, and along the arthrotomy site. A 5 mm stabilized rotating platform polyethylene insert was inserted and the knee was placed through a range of motion with excellent mediolateral soft tissue balancing appreciated and excellent patellar tracking noted. 2 medium drains were placed in the wound bed and brought out through separate stab incisions to be attached to a reinfusion system. The medial parapatellar portion of the incision was reapproximated using interrupted sutures of #1 Vicryl. Subcutaneous tissue was then injected with a total of 30 cc of 0.25% Marcaine with epinephrine. Subcutaneous tissue was approximated in layers using first #0 Vicryl followed #2-0 Vicryl. The skin was approximated with skin staples. A sterile dressing was applied.  The patient tolerated the procedure well and was transported to the recovery room in stable condition.    Finnleigh Marchetti P. Holley Bouche., M.D.

## 2016-02-26 NOTE — Evaluation (Signed)
Physical Therapy Evaluation Patient Details Name: Veronica Hoffman MRN: [id number] DOB: January 21, 1941 Today's Date: 02/26/2016   History of Present Illness  Patient is a 75 y/o female admitted for R TKR on 02/26/2016  Clinical Impression  Patient seen on POD#0 after R TKR, she became somewhat diaphoretic towards the end of the session, however able to tolerate otherwise. She is able to complete partial range SLRs and LAQs independently, limited more by decreased ROM around the knee currently. She takes prolonged time and significant assist to come to standing in this session, though this appears to be more due to poor technique as she was somewhat anxious to make attempt. She is able to complete there-ex with no additional complaints. She did become diaphoretic after standing, though her BP was stable. She was also nauseated, however this alleviated. PT is anticipating her standing transfer and ambulation will improve tomorrow, will provide follow up recommendations in subsequent treatments.     Follow Up Recommendations Home health PT    Equipment Recommendations  Rolling walker with 5" wheels    Recommendations for Other Services       Precautions / Restrictions Precautions Precautions: Fall Required Braces or Orthoses: Knee Immobilizer - Right Knee Immobilizer - Right: Discontinue once straight leg raise with < 10 degree lag Restrictions Weight Bearing Restrictions: Yes RLE Weight Bearing: Weight bearing as tolerated      Mobility  Bed Mobility Overal bed mobility: Needs Assistance Bed Mobility: Supine to Sit;Sit to Supine     Supine to sit: Min assist Sit to supine: Min guard   General bed mobility comments: Patient educated on hooking technique, she requires some assistance to bring LEs off edge of bed, able to use UEs to manage torso.   Transfers Overall transfer level: Needs assistance Equipment used: Rolling walker (2 wheeled) Transfers: Sit to/from Stand Sit to Stand:  Min assist;Mod assist         General transfer comment: Patient somewhat anxious and did not wait for PT to deliver cuing, she requires assistance due to poor quadricep output on RLE, torso remains flexed throughout transfer.   Ambulation/Gait                Stairs            Wheelchair Mobility    Modified Rankin (Stroke Patients Only)       Balance Overall balance assessment: Needs assistance Sitting-balance support: No upper extremity supported Sitting balance-Leahy Scale: Good     Standing balance support: Bilateral upper extremity supported Standing balance-Leahy Scale: Fair                               Pertinent Vitals/Pain Pain Assessment: Faces Faces Pain Scale: Hurts little more Pain Location: R knee Pain Descriptors / Indicators: Aching;Operative site guarding Pain Intervention(s): Limited activity within patient's tolerance;Monitored during session;Repositioned    Home Living Family/patient expects to be discharged to:: Private residence Living Arrangements: Spouse/significant other Available Help at Discharge: Family Type of Home: House Home Access: Stairs to enter Entrance Stairs-Rails: Can reach both Entrance Stairs-Number of Steps: 3 Home Layout: One level Home Equipment: Walker - 2 wheels;Wheelchair - manual;Cane - single point      Prior Function Level of Independence: Independent with assistive device(s)         Comments: Patient has been ambulating with an AD since last August secondary to knee pain.      Hand Dominance  Extremity/Trunk Assessment   Upper Extremity Assessment: Overall WFL for tasks assessed           Lower Extremity Assessment: RLE deficits/detail RLE Deficits / Details: Able to complete partial range LAQs and SLRs, no buckling in standing.        Communication   Communication: No difficulties  Cognition Arousal/Alertness: Awake/alert Behavior During Therapy: WFL for tasks  assessed/performed;Anxious Overall Cognitive Status: Within Functional Limits for tasks assessed                      General Comments General comments (skin integrity, edema, etc.): Patient became diaphoretic momentarily, BP stable in 150s/80s. Seemed to alleviate once in supine.     Exercises Total Joint Exercises Ankle Circles/Pumps: AROM;Both;10 reps Heel Slides: AROM;AAROM;Both;10 reps Hip ABduction/ADduction: AROM;Both;10 reps Straight Leg Raises: AROM;Both;10 reps Long Arc Quad: AROM;Both;10 reps (Limited ROM on RLE) Goniometric ROM: 3-34 pain limiting.       Assessment/Plan    PT Assessment Patient needs continued PT services  PT Diagnosis Difficulty walking;Generalized weakness   PT Problem List Decreased strength;Decreased knowledge of use of DME;Decreased range of motion;Decreased safety awareness;Pain;Decreased activity tolerance;Decreased balance;Decreased mobility  PT Treatment Interventions DME instruction;Gait training;Stair training;Therapeutic activities;Therapeutic exercise;Balance training;Manual techniques   PT Goals (Current goals can be found in the Care Plan section) Acute Rehab PT Goals Patient Stated Goal: To return home  PT Goal Formulation: With patient Time For Goal Achievement: 03/11/16 Potential to Achieve Goals: Good    Frequency BID   Barriers to discharge        Co-evaluation               End of Session Equipment Utilized During Treatment: Gait belt Activity Tolerance: Patient tolerated treatment well;Patient limited by fatigue Patient left: in bed;with call bell/phone within reach;with bed alarm set Nurse Communication: Mobility status         Time: [id number] PT Time Calculation (min) (ACUTE ONLY): 31 min   Charges:   PT Evaluation $PT Eval Moderate Complexity: 1 Procedure PT Treatments $Therapeutic Exercise: 8-22 mins   PT G Codes:       Kerman Passey, PT, DPT    02/26/2016, 7:00 PM

## 2016-02-26 NOTE — Transfer of Care (Signed)
Immediate Anesthesia Transfer of Care Note  Patient: Veronica Hoffman  Procedure(s) Performed: Procedure(s): COMPUTER ASSISTED TOTAL KNEE ARTHROPLASTY (Right)  Patient Location: PACU  Anesthesia Type:Spinal  Level of Consciousness: awake, alert  and oriented  Airway & Oxygen Therapy: Patient Spontanous Breathing and Patient connected to face mask oxygen  Post-op Assessment: Report given to RN and Post -op Vital signs reviewed and stable  Post vital signs: stable  Last Vitals:  Filed Vitals:   02/26/16 0620 02/26/16 1104  BP: 150/56 124/62  Pulse: 94 85  Temp: 37 C 36.6 C  Resp: 16 15    Last Pain: There were no vitals filed for this visit.       Complications: No apparent anesthesia complications

## 2016-02-26 NOTE — Anesthesia Preprocedure Evaluation (Signed)
Anesthesia Evaluation  Patient identified by MRN, date of birth, ID band Patient awake    Reviewed: Allergy & Precautions, H&P , NPO status , Patient's Chart, lab work & pertinent test results, reviewed documented beta blocker date and time   History of Anesthesia Complications (+) PONV and history of anesthetic complications  Airway Mallampati: I  TM Distance: >3 FB Neck ROM: full    Dental no notable dental hx. (+) Caps, Partial Upper, Missing   Pulmonary neg pulmonary ROS,    Pulmonary exam normal breath sounds clear to auscultation       Cardiovascular Exercise Tolerance: Good + Peripheral Vascular Disease  Normal cardiovascular exam Rhythm:regular Rate:Normal     Neuro/Psych negative neurological ROS  negative psych ROS   GI/Hepatic Neg liver ROS, GERD  Controlled,  Endo/Other  negative endocrine ROS  Renal/GU negative Renal ROS  negative genitourinary   Musculoskeletal   Abdominal   Peds  Hematology negative hematology ROS (+)   Anesthesia Other Findings Past Medical History:   Breast cancer ([place])                             06/25/86        Comment:rt mastecomy/chemo  and rad   Elevated cholesterol                                         Raynaud disease                                              Spastic colon                                                Arthritis                                                    Neuropathy ([facility])                                             Complication of anesthesia                                     Comment:occ nausea   Pulmonary hypertension ([facility])                                 Scleroderma ([facility]CC)                                            Deviated septum  Reproductive/Obstetrics negative OB ROS                             Anesthesia Physical Anesthesia Plan  ASA: II  Anesthesia Plan:  Spinal   Post-op Pain Management:    Induction:   Airway Management Planned:   Additional Equipment:   Intra-op Plan:   Post-operative Plan:   Informed Consent: I have reviewed the patients History and Physical, chart, labs and discussed the procedure including the risks, benefits and alternatives for the proposed anesthesia with the patient or authorized representative who has indicated his/her understanding and acceptance.   Dental Advisory Given  Plan Discussed with: Anesthesiologist, CRNA and Surgeon  Anesthesia Plan Comments:         Anesthesia Quick Evaluation

## 2016-02-26 NOTE — Brief Op Note (Signed)
02/26/2016  11:10 AM  PATIENT:  Gaylene Brooks  75 y.o. female  PRE-OPERATIVE DIAGNOSIS:  OSTEOARTHRITIS of the right knee  POST-OPERATIVE DIAGNOSIS:  Same  PROCEDURE:  Procedure(s): COMPUTER ASSISTED TOTAL KNEE ARTHROPLASTY (Right)  SURGEON:  Surgeon(s) and Role:    * Dereck Leep, MD - Primary  ASSISTANTS: Vance Peper, PA   ANESTHESIA:   spinal  EBL:  Total I/O In: 1200 [I.V.:1200] Out: 450 [Urine:450]  BLOOD ADMINISTERED:none  DRAINS: none   LOCAL MEDICATIONS USED:  MARCAINE    and OTHER Exparel  SPECIMEN:  No Specimen  DISPOSITION OF SPECIMEN:  N/A  COUNTS:  YES  TOURNIQUET:   113 minutes  DICTATION: .Dragon Dictation  PLAN OF CARE: Admit to inpatient   PATIENT DISPOSITION:  PACU - hemodynamically stable.   Delay start of Pharmacological VTE agent (>24hrs) due to surgical blood loss or risk of bleeding: yes

## 2016-02-26 NOTE — Anesthesia Procedure Notes (Signed)
Spinal  Start time: 02/26/2016 7:18 AM End time: 02/26/2016 7:25 AM Staffing Anesthesiologist: Martha Clan Resident/CRNA: Rosalie Gelpi Performed by: resident/CRNA  Preanesthetic Checklist Completed: patient identified, site marked, surgical consent, pre-op evaluation, timeout performed, IV checked, risks and benefits discussed and monitors and equipment checked Spinal Block Patient position: sitting Prep: ChloraPrep Patient monitoring: heart rate, continuous pulse ox and blood pressure Approach: midline Location: L3-4 Injection technique: single-shot Needle Needle type: Quincke  Needle gauge: 25 G

## 2016-02-27 ENCOUNTER — Encounter: Payer: Self-pay | Admitting: Orthopedic Surgery

## 2016-02-27 LAB — BASIC METABOLIC PANEL
Anion gap: 8 (ref 5–15)
BUN: 12 mg/dL (ref 6–20)
CALCIUM: 8.2 mg/dL — AB (ref 8.9–10.3)
CHLORIDE: 99 mmol/L — AB (ref 101–111)
CO2: 25 mmol/L (ref 22–32)
CREATININE: 0.86 mg/dL (ref 0.44–1.00)
GFR calc non Af Amer: >60 mL/min (ref >60)
GLUCOSE: 122 mg/dL — AB (ref 65–99)
Potassium: 4 mmol/L (ref 3.5–5.1)
Sodium: 132 mmol/L — ABNORMAL LOW (ref 135–145)

## 2016-02-27 LAB — CBC
HCT: 35.5 % (ref 35.0–47.0)
HEMOGLOBIN: 11.7 g/dL — AB (ref 12.0–16.0)
MCH: 31 pg (ref 26.0–34.0)
MCHC: 33 g/dL (ref 32.0–36.0)
MCV: 93.9 fL (ref 80.0–100.0)
Platelets: 247 10*3/uL (ref 150–440)
RBC: 3.79 MIL/uL — ABNORMAL LOW (ref 3.80–5.20)
RDW: 13 % (ref 11.5–14.5)
WBC: 11.1 10*3/uL — ABNORMAL HIGH (ref 3.6–11.0)

## 2016-02-27 MED ORDER — METOCLOPRAMIDE HCL 5 MG/ML IJ SOLN
5.0000 mg | Freq: Four times a day (QID) | INTRAMUSCULAR | Status: AC
  Administered 2016-02-27 (×2): 5 mg via INTRAVENOUS
  Administered 2016-02-27 (×2): 10 mg via INTRAVENOUS
  Filled 2016-02-27 (×4): qty 2

## 2016-02-27 NOTE — Anesthesia Postprocedure Evaluation (Signed)
Anesthesia Post Note  Patient: Veronica Hoffman  Procedure(s) Performed: Procedure(s) (LRB): COMPUTER ASSISTED TOTAL KNEE ARTHROPLASTY (Right)  Patient location during evaluation: Nursing Unit Anesthesia Type: Spinal Level of consciousness: awake and alert Pain management: satisfactory to patient Vital Signs Assessment: post-procedure vital signs reviewed and stable Respiratory status: spontaneous breathing Cardiovascular status: blood pressure returned to baseline Postop Assessment: no headache and no backache Anesthetic complications: no    Last Vitals:  Filed Vitals:   02/26/16 2053 02/27/16 0353  BP: 165/78 153/78  Pulse: 115 108  Temp: 36.6 C 37.1 C  Resp: 18 18    Last Pain:  Filed Vitals:   02/27/16 0420  PainSc: Asleep                 Brantley Fling

## 2016-02-27 NOTE — Progress Notes (Signed)
Pt with persistent  Nausea since postop. rn spoke to dr hooten who reported pt may have reglan 5-10mg   Iv q6hr x4 doses

## 2016-02-27 NOTE — Progress Notes (Signed)
Patient very selective with medication, refuses most. Patient education given, with understanding

## 2016-02-27 NOTE — Progress Notes (Signed)
   Subjective: 1 Day Post-Op Procedure(s) (LRB): COMPUTER ASSISTED TOTAL KNEE ARTHROPLASTY (Right) Patient reports pain as mild.   Patient is has been extremely nauseaed during the night. c/o having a very sensitive stomach. Pt states she can't take most medicines We will start therapy today.  Plan is to go Rehab after hospital stay. nausea and vomiting Patient denies any chest pains or shortness of breath. Objective: Vital signs in last 24 hours: Temp:  [97.2 F (36.2 C)-98.8 F (37.1 C)] 98.8 F (37.1 C) (04/27 0353) Pulse Rate:  [74-135] 108 (04/27 0353) Resp:  [10-20] 18 (04/27 0353) BP: (124-165)/(55-78) 153/78 mmHg (04/27 0353) SpO2:  [97 %-100 %] 98 % (04/27 0353) Weight:  [79.561 kg (175 lb 6.4 oz)] 79.561 kg (175 lb 6.4 oz) (04/26 1322) Pt has original dressing in place   Heels are non tender and elevated off the bed using rolled towels Intake/Output from previous day: 04/26 0701 - 04/27 0700 In: 1580 [I.V.:1580] Out: 2970 [Urine:2450; Emesis/NG output:400; Drains:50] Intake/Output this shift:     Recent Labs  02/27/16 0411  HGB 11.7*    Recent Labs  02/27/16 0411  WBC 11.1*  RBC 3.79*  HCT 35.5  PLT 247    Recent Labs  02/27/16 0411  NA 132*  K 4.0  CL 99*  CO2 25  BUN 12  CREATININE 0.86  GLUCOSE 122*  CALCIUM 8.2*   No results for input(s): LABPT, INR in the last 72 hours.  EXAM General - Patient is Alert, Appropriate and Oriented Extremity - Neurologically intact Neurovascular intact Sensation intact distally Intact pulses distally Dorsiflexion/Plantar flexion intact Dressing - dressing C/D/I Motor Function - intact, moving foot and toes well on exam. Able to do SLR on own  Past Medical History  Diagnosis Date  . Breast cancer ([place]) 06/25/86    rt mastecomy/chemo  and rad  . Elevated cholesterol   . Raynaud disease   . Spastic colon   . Arthritis   . Neuropathy ([place])   . Complication of anesthesia     occ nausea  .  Pulmonary hypertension ([place])   . Scleroderma ([place])   . Deviated septum     Assessment/Plan: 1 Day Post-Op Procedure(s) (LRB): COMPUTER ASSISTED TOTAL KNEE ARTHROPLASTY (Right) Active Problems:   S/P total knee arthroplasty  Estimated body mass index is 27.05 kg/(m^2) as calculated from the following:   Height as of this encounter: 5' 7.5" (1.715 m).   Weight as of this encounter: 79.561 kg (175 lb 6.4 oz). Advance diet Up with therapy D/C IV fluids Discharge to SNF  Labs: reviewed DVT Prophylaxis - Lovenox, Foot Pumps and TED hose Weight-Bearing as tolerated to right leg D/C O2 and Pulse OX and try on Room Air Labs in am  [name]. Bridgman Emelle 02/27/2016, 7:16 AM

## 2016-02-27 NOTE — Care Management (Signed)
Followed up with patient regarding home health list provided. She would like to use [facility] which has been notified.

## 2016-02-27 NOTE — Care Management Note (Addendum)
Case Management Note  Patient Details  Name: Veronica Hoffman MRN: [id number] Date of Birth: Sep 01, 1941  Subjective/Objective:                  Met with patient who seems a little anxious because she "didn't get her Claritin and threw up all night". She refused her breakfast tray this morning but is sipping on Ginger Ale and saltine crackers; Assistance requested for patient to go to bathroom. She states she has a bedside commode but will need a rolling walker. She uses [facility] for Rx [phone]. She lives with her fiance whom she states she will have assistance from. She is unsure what home health agency she would like to use.   Action/Plan: List of home health agencies provided. Provided patient with her reading glasses from her hospital bag. Lovenox 22m #14 [place] in to [facility] Rolling walker requested from [facility] RNCM will continue to follow.   Expected Discharge Date:  03/01/16               Expected Discharge Plan:     In-House Referral:     Discharge planning Services  CM Consult  Post Acute Care Choice:  Home Health, Durable Medical Equipment Choice offered to:  Patient  DME Arranged:  Walker rolling [facility]ME [facility]     Status of Service:  In process, will continue to follow  Medicare Important Message Given:    Date Medicare IM Given:    Medicare IM give by:    Date Additional Medicare IM Given:    Additional Medicare Important Message give by:     If discussed at [facility] Stay Meetings, dates discussed:    Additional Comments: Walker [place] by [facility] Lovenox $ 65.55.   AMarshell Garfinkel RN 02/27/2016, 8:26 AM

## 2016-02-27 NOTE — Progress Notes (Signed)
Gave prn oxy 5mg , and mom for constipation. Complaints of food, poor intake

## 2016-02-27 NOTE — Progress Notes (Signed)
Physical Therapy Treatment Patient Details Name: Veronica Hoffman MRN: [id number] DOB: 03-02-1941 Today's Date: 02/27/2016    History of Present Illness Patient is a 75 y/o female admitted for R TKR on 02/26/2016    PT Comments    Pt was able to significantly improve during this session. After education provided, pt was able to feel more comfortable and less anxious to push herself a little harder. During this session she performed transfers and ambulated up to 225 ft with FWW and min guard. She demonstrated fair speed and steady gait with no LOB during session. AAROM R knee improved to -2 to 71 degrees after some AAROM stretching. Pt completed bed mobility with mod I, being able to lift her leg up on to the bed herself. Plan to perform stair training and continue therex and gait tomorrow.  Follow Up Recommendations  Home health PT     Equipment Recommendations  Rolling walker with 5" wheels    Recommendations for Other Services       Precautions / Restrictions Precautions Precautions: Fall Required Braces or Orthoses: Knee Immobilizer - Right Knee Immobilizer - Right:  (Performed 10 SLR R LE, no KI needed) Restrictions Weight Bearing Restrictions: Yes RLE Weight Bearing: Weight bearing as tolerated    Mobility  Bed Mobility Overal bed mobility: Modified Independent Bed Mobility: Sit to Supine       Sit to supine: Modified independent (Device/Increase time)   General bed mobility comments: uses rail, able to lift R LE up onto bed  Transfers Overall transfer level: Needs assistance Equipment used: Rolling walker (2 wheeled) Transfers: Sit to/from [facility] Sit to Stand: Min guard Stand pivot transfers: Min guard       General transfer comment: Cues for hand placement, anterior weight shifting and feet positioning.  Ambulation/Gait Ambulation/Gait assistance: Min guard Ambulation Distance (Feet): 225 Feet Assistive device: Rolling walker (2  wheeled) Gait Pattern/deviations: Step-to pattern;Decreased weight shift to right Gait velocity: decreased   General Gait Details: Pt demonstrated significantly improved gait with fair speed and good stability with no LOB. With cues for increased step length, heel to toe and knee extension pt was able to perform smooth gait pattern.    Stairs            Wheelchair Mobility    Modified Rankin (Stroke Patients Only)       Balance Overall balance assessment: Needs assistance Sitting-balance support: No upper extremity supported Sitting balance-Leahy Scale: Good     Standing balance support: Bilateral upper extremity supported Standing balance-Leahy Scale: Good Standing balance comment: steady with no LOB                    Cognition Arousal/Alertness: Awake/alert Behavior During Therapy: WFL for tasks assessed/performed;Anxious Overall Cognitive Status: Within Functional Limits for tasks assessed                      Exercises Total Joint Exercises Goniometric ROM: R knee AAROM -2 to 71 degrees Other Exercises Other Exercises: R knee flexion and extension stretching with AAROM 20 x 5 sec with cues for pursed lip breathing to increase pt's tolerance. Other Exercises: Extensive pt education on importance of increasing knee ROM and increasing ambulation in order to progress towards PLOF. Explained to pt that pain is normal after surgery and staying on top of it with prescribed medication is important to increase activity tolerance. After a long discussion, pt felt more comfortable and willing to attempt  increased ROM and ambulation.    General Comments General comments (skin integrity, edema, etc.): hemovac, R knee bandaged      Pertinent Vitals/Pain Pain Assessment: 0-10 Pain Score: 3  Pain Location: R knee Pain Descriptors / Indicators: Aching Pain Intervention(s): Limited activity within patient's tolerance;Monitored during session    [place] expects to be discharged to:: Private residence Living Arrangements: Spouse/significant other Available Help at Discharge: Family Type of Home: House Home Access: Stairs to enter Entrance Stairs-Rails: Can reach both Home Layout: One level Home Equipment: Environmental consultant - 2 wheels;Cane - single point      Prior Function Level of Independence: Independent with assistive device(s)      Comments: Patient has been ambulating with an AD since last August secondary to knee pain.    PT Goals (current goals can now be found in the care plan section) Acute Rehab PT Goals Patient Stated Goal: To line dance again. PT Goal Formulation: With patient Time For Goal Achievement: 03/11/16 Potential to Achieve Goals: Good Progress towards PT goals: Progressing toward goals    Frequency  BID    PT Plan Current plan remains appropriate    Co-evaluation             End of Session Equipment Utilized During Treatment: Gait belt Activity Tolerance: Patient tolerated treatment well Patient left: in bed;with call bell/phone within reach;with bed alarm set;with SCD's reapplied     Time: [id number] PT Time Calculation (min) (ACUTE ONLY): 43 min  Charges:  $Gait Training: 8-22 mins $Therapeutic Exercise: 8-22 mins $Therapeutic Activity: 8-22 mins                    G Codes:      Neoma Laming, PT, DPT  02/27/2016, 3:40 PM [phone]

## 2016-02-27 NOTE — Progress Notes (Signed)
Physical Therapy Treatment Patient Details Name: Veronica Hoffman MRN: [id number] DOB: [birth year] Today's Date: 02/27/2016    History of Present Illness      PT Comments    Pt anxious but agrees to session.  Supine AAROM as described below to right leg.  Pt unable to complete 10 straight leg raises without assist this morning.  Knee immobilizer donned prior to getting out of bed.  During attempt to get to bedside commode, pt incontinent of urine.  Assisted in care.  To edge of bed with min a x 1.  Stood with min a x 1 and mod verbal cues for hand placements.  She transferred to recliner at bedside where she was able to complete standing exercises and described below.  After rest, she was able to ambulate forward and backwards x 2 in room with min guard x 1.  She had no loss of balance but seems to self limit herself due to fear/anxiety verses pain.  Pt initially wanted to return back to bed after session but encouraged and explained need to remain out of bed.  Agreed.    Follow Up Recommendations  Home health PT     Equipment Recommendations  Rolling walker with 5" wheels    Recommendations for Other Services       Precautions / Restrictions Precautions Precautions: Fall Required Braces or Orthoses: Knee Immobilizer - Right Knee Immobilizer - Right: Discontinue once straight leg raise with < 10 degree lag Restrictions Weight Bearing Restrictions: Yes RLE Weight Bearing: Weight bearing as tolerated    Mobility  Bed Mobility Overal bed mobility: Needs Assistance Bed Mobility: Supine to Sit     Supine to sit: Min assist        Transfers Overall transfer level: Needs assistance Equipment used: Rolling walker (2 wheeled) Transfers: Sit to/from Stand Sit to Stand: Min assist         General transfer comment: anxious but responds well to gentle cues  Ambulation/Gait Ambulation/Gait assistance: Min assist Ambulation Distance (Feet): 10 Feet Assistive device: Rolling  walker (2 wheeled) Gait Pattern/deviations: Step-to pattern;Decreased stance time - right Gait velocity: decreased Gait velocity interpretation: <1.8 ft/sec, indicative of risk for recurrent falls General Gait Details: KI used as she was unable to do 10 [employer]   Stairs            Wheelchair Mobility    Modified Rankin (Stroke Patients Only)       Balance Overall balance assessment: Needs assistance Sitting-balance support: No upper extremity supported Sitting balance-Leahy Scale: Good     Standing balance support: Bilateral upper extremity supported Standing balance-Leahy Scale: Fair                      Cognition Arousal/Alertness: Awake/alert Behavior During Therapy: WFL for tasks assessed/performed;Anxious Overall Cognitive Status: Within Functional Limits for tasks assessed                      Exercises Total Joint Exercises Ankle Circles/Pumps: 20 reps;Supine;AROM;Both Heel Slides: AAROM;Right;20 reps;Supine Hip ABduction/ADduction: AAROM;Right;20 reps;Supine Straight Leg Raises: AAROM;Right;20 reps;Supine Long Arc Quad: AAROM;Right;20 reps;Seated Goniometric ROM: 3-40    General Comments        Pertinent Vitals/Pain Pain Assessment: 0-10 Pain Score: 4  Pain Location: R knee Pain Descriptors / Indicators: Aching Pain Intervention(s): Limited activity within patient's tolerance    Home Living  Prior Function            PT Goals (current goals can now be found in the care plan section) Acute Rehab PT Goals Patient Stated Goal: To return home     Frequency  BID    PT Plan Current plan remains appropriate    Co-evaluation             End of Session Equipment Utilized During Treatment: Gait belt;Right knee immobilizer Activity Tolerance: Patient tolerated treatment well;Patient limited by fatigue Patient left: in chair;with call bell/phone within reach;with chair alarm set;with  family/visitor present     Time: [id number] PT Time Calculation (min) (ACUTE ONLY): 50 min  Charges:  $Gait Training: 8-22 mins $Therapeutic Exercise: 8-22 mins $Therapeutic Activity: 8-22 mins                    G Codes:     Chesley Noon, PTA Mar 10, 2016, 10:58 AM

## 2016-02-27 NOTE — Evaluation (Signed)
Occupational Therapy Evaluation Patient Details Name: Veronica Hoffman MRN: [id number] DOB: 04-25-1941 Today's Date: 02/27/2016    History of Present Illness Patient is a 75 y/o female admitted for R TKR on 02/26/2016   Clinical Impression   Pt. Is a 75 y.o.female who was admitted for a right TKR. Pt presents with limited ROM, Pain, weakness, and impaired functional mobility which hinder her ability to complete ADL and IADL tasks. Pt. could benefit from skilled OT services to review A/E use for LE ADLs, to review necessary home modifications, and to improve functional mobility for ADL/IADLs in order to work towards regaining Independence with ADL/IADLs. Pt. Has nausea which is limiting pt. today.    Follow Up Recommendations  Home health OT    Equipment Recommendations       Recommendations for Other Services PT consult     Precautions / Restrictions Precautions Precautions: Fall Required Braces or Orthoses: Knee Immobilizer - Right Knee Immobilizer - Right: Discontinue once straight leg raise with < 10 degree lag Restrictions Weight Bearing Restrictions: Yes RLE Weight Bearing: Weight bearing as tolerated      Mobility   Balance     ADL Overall ADL's : Needs assistance/impaired Eating/Feeding: Independent   Grooming: Independent               Lower Body Dressing: Moderate assistance                 General ADL Comments: Reviewed A/E use for LE dressing.     Vision     Perception     Praxis      Pertinent Vitals/Pain Pain Assessment: 0-10 Pain Score: 4  Pain Location: Right Knee Pain Descriptors / Indicators: Aching Pain Intervention(s): Limited activity within patient's tolerance     Hand Dominance     Extremity/Trunk Assessment Upper Extremity Assessment Upper Extremity Assessment: Overall WFL for tasks assessed           Communication Communication Communication: No difficulties   Cognition Arousal/Alertness:  Awake/alert Behavior During Therapy: WFL for tasks assessed/performed;Anxious Overall Cognitive Status: Within Functional Limits for tasks assessed                     General Comments       Exercises       Shoulder Instructions      Home Living Family/patient expects to be discharged to:: Private residence Living Arrangements: Spouse/significant other Available Help at Discharge: Family Type of Home: House Home Access: Stairs to enter   Entrance Stairs-Rails: Can reach both Home Layout: One level               Home Equipment: Environmental consultant - 2 wheels;Cane - single point          Prior Functioning/Environment Level of Independence: Independent with assistive device(s)        Comments: Patient has been ambulating with an AD since last August secondary to knee pain.     OT Diagnosis: Generalized weakness;Acute pain   OT Problem List: Decreased strength;Decreased activity tolerance;Impaired balance (sitting and/or standing);Pain;Decreased knowledge of use of DME or AE   OT Treatment/Interventions: Self-care/ADL training;Therapeutic exercise;DME and/or AE instruction;Therapeutic activities;Patient/family education    OT Goals(Current goals can be found in the care plan section) Acute Rehab OT Goals Patient Stated Goal: To line dance again. Potential to Achieve Goals: Good  OT Frequency: Min 2X/week   Barriers to D/C:            Co-evaluation  End of Session    Activity Tolerance: Patient tolerated treatment well (Nausea) Patient left: in chair;with bed alarm set;with call bell/phone within reach   Time: 1130-1145 OT Time Calculation (min): 15 min Charges:  OT General Charges $OT Visit: 1 Procedure OT Evaluation $OT Eval Low Complexity: 1 Procedure G-Codes:    Harrel Carina, MS, OTR/L Harrel Carina 02/27/2016, 12:11 PM

## 2016-02-27 NOTE — Progress Notes (Signed)
Clinical Social Worker (CSW) received SNF consult. PT is recommending home health. RN Case Manager is aware of above. Please reconsult if future social work needs arise. CSW signing off.   [place] Hesketh Morgan, LCSW [phone] 

## 2016-02-28 LAB — CBC
HEMATOCRIT: 34.5 % — AB (ref 35.0–47.0)
HEMOGLOBIN: 11.7 g/dL — AB (ref 12.0–16.0)
MCH: 31.7 pg (ref 26.0–34.0)
MCHC: 34 g/dL (ref 32.0–36.0)
MCV: 93.2 fL (ref 80.0–100.0)
Platelets: 286 10*3/uL (ref 150–440)
RBC: 3.7 MIL/uL — AB (ref 3.80–5.20)
RDW: 13 % (ref 11.5–14.5)
WBC: 11.5 10*3/uL — AB (ref 3.6–11.0)

## 2016-02-28 LAB — BASIC METABOLIC PANEL
ANION GAP: 9 (ref 5–15)
BUN: 11 mg/dL (ref 6–20)
CHLORIDE: 101 mmol/L (ref 101–111)
CO2: 28 mmol/L (ref 22–32)
Calcium: 8.9 mg/dL (ref 8.9–10.3)
Creatinine, Ser: 0.84 mg/dL (ref 0.44–1.00)
GFR calc non Af Amer: >60 mL/min (ref >60)
Glucose, Bld: 121 mg/dL — ABNORMAL HIGH (ref 65–99)
Potassium: 3.5 mmol/L (ref 3.5–5.1)
Sodium: 138 mmol/L (ref 135–145)

## 2016-02-28 MED ORDER — TRAMADOL HCL 50 MG PO TABS: 50.0000 mg | ORAL_TABLET | ORAL | Status: DC | PRN

## 2016-02-28 MED ORDER — ENOXAPARIN SODIUM 40 MG/0.4ML ~~LOC~~ SOLN: 40.0000 mg | SUBCUTANEOUS | Status: DC

## 2016-02-28 MED ORDER — OXYCODONE HCL 5 MG PO TABS: 5.0000 mg | ORAL_TABLET | ORAL | Status: DC | PRN

## 2016-02-28 MED ORDER — LACTULOSE 10 GM/15ML PO SOLN: 10.0000 g | Freq: Two times a day (BID) | ORAL | Status: DC | PRN

## 2016-02-28 NOTE — Discharge Summary (Signed)
Physician Discharge Summary  Patient ID: Veronica Hoffman MRN: [id number] DOB/AGE: 75-Sep-1942 75 y.o.  Admit date: 02/26/2016 Discharge date: 02/28/2016  Admission Diagnoses:  OSTEOARTHRITIS   Discharge Diagnoses: Patient Active Problem List   Diagnosis Date Noted  . S/P total knee arthroplasty 02/26/2016  . Paroxysmal digital cyanosis 06/07/2015  . Pulmonary hypertension ([place]) 06/07/2015    Past Medical History  Diagnosis Date  . Breast cancer ([place]) 06/25/86    rt mastecomy/chemo  and rad  . Elevated cholesterol   . Raynaud disease   . Spastic colon   . Arthritis   . Neuropathy ([place])   . Complication of anesthesia     occ nausea  . Pulmonary hypertension ([place])   . Scleroderma ([place])   . Deviated septum      Transfusion: Autovac transfusion given the first 6 hours post op   Consultants (if any):  Case management for home health assistance   Discharged Condition: Improved  Hospital Course: Veronica Hoffman is an 75 y.o. female who was admitted 02/26/2016 with a diagnosis of degenerative arthrosis of right knee and went to the operating room on 02/26/2016 and underwent the above named procedures.    Surgeries:Procedure(s): COMPUTER ASSISTED TOTAL KNEE ARTHROPLASTY on 02/26/2016  PRE-OPERATIVE DIAGNOSIS: Degenerative arthrosis of the right knee, primary  POST-OPERATIVE DIAGNOSIS: Same  PROCEDURE: Right total knee arthroplasty using computer-assisted navigation  SURGEON: Marciano Sequin. M.D.  ASSISTANT: Vance Peper, PA (present and scrubbed throughout the case, critical for assistance with exposure, retraction, instrumentation, and closure)  ANESTHESIA: spinal  ESTIMATED BLOOD LOSS: 50 mL  FLUIDS REPLACED: 1200 mL of crystalloid  TOURNIQUET TIME: 113 minutes  DRAINS: 2 medium drains to a reinfusion system  SOFT TISSUE RELEASES: Anterior cruciate ligament, posterior cruciate ligament, deep medial collateral ligament, patellofemoral ligament   IMPLANTS  UTILIZED: DePuy Attune size 6 narrow posterior stabilized femoral component (cemented), size 4 rotating platform tibial component (cemented), 32 mm medialized dome patella (cemented), and a 5 mm stabilized rotating platform polyethylene insert.  INDICATIONS FOR SURGERY: Veronica Hoffman is a 75 y.o. year old female with a long history of progressive knee pain. X-rays demonstrated severe degenerative changes in tricompartmental fashion. The patient had not seen any significant improvement despite conservative nonsurgical intervention. After discussion of the risks and benefits of surgical intervention, the patient expressed understanding of the risks benefits and agree with plans for total knee arthroplasty.   The risks, benefits, and alternatives were discussed at length including but not limited to the risks of infection, bleeding, nerve injury, stiffness, blood clots, the need for revision surgery, cardiopulmonary complications, among others, and they were willing to proceed. Patient tolerated the surgery well. No complications .Patient was taken to PACU where she was stabilized and then transferred to the orthopedic floor.  Patient started on Lovenox 30 q 12 hrs. Foot pumps applied bilaterally at 80 mm hg. Heels elevated off bed with rolled towels. No evidence of DVT. Calves non tender. Negative Homan. Physical therapy started on day #1 for gait training and transfer with OT starting on  day #1 for ADL and assisted devices. Patient has done well with therapy. Ambulated greater that 225 feet upon being discharged. Was able to go up and down 4 step safely and independently.  Patient's IV and foley were d/c on day #1 and the hemovac was d/c'd on day #2. Dressing changed on day #2   She was given perioperative antibiotics:  Anti-infectives    Start  Dose/Rate Route Frequency Ordered Stop   02/26/16 1300  ceFAZolin (ANCEF) IVPB 2g/100 mL premix     2 g 200 mL/hr over 30 Minutes Intravenous Every 6  hours 02/26/16 1248 02/27/16 0631   02/26/16 0603  ceFAZolin (ANCEF) 2-4 GM/100ML-% IVPB    Comments:  KENNEDY, DENISE: cabinet override      02/26/16 0603 02/26/16 1814   02/26/16 0324  ceFAZolin (ANCEF) IVPB 2g/100 mL premix     2 g 200 mL/hr over 30 Minutes Intravenous On call to O.R. 02/26/16 [id number] 02/26/16 0743    .  She was fitted with AV1 compression foot pump devices,instructed on heel pumps, early ambulation, and fitted with TED stockings bilaterally  for DVT prophylaxis.  She benefited maximally from the hospital stay and there were no complications.    Recent vital signs:  Filed Vitals:   02/27/16 1951 02/28/16 0437  BP: 148/65 158/64  Pulse: 110 109  Temp: 97.8 F (36.6 C) 98.4 F (36.9 C)  Resp: 18 18    Recent laboratory studies:  Lab Results  Component Value Date   HGB 11.7* 02/28/2016   HGB 11.7* 02/27/2016   HGB 13.1 02/12/2016   Lab Results  Component Value Date   WBC 11.5* 02/28/2016   PLT 286 02/28/2016   Lab Results  Component Value Date   INR 0.89 02/12/2016   Lab Results  Component Value Date   NA 138 02/28/2016   K 3.5 02/28/2016   CL 101 02/28/2016   CO2 28 02/28/2016   BUN 11 02/28/2016   CREATININE 0.84 02/28/2016   GLUCOSE 121* 02/28/2016    Discharge Medications:     Medication List    TAKE these medications        atorvastatin 40 MG tablet  Commonly known as:  LIPITOR  Take 40 mg by mouth daily at 6 PM.     B-12 2500 MCG Tabs  Take 1 capsule by mouth every morning.     calcium carbonate 600 MG Tabs tablet  Commonly known as:  OS-CAL  Take 600 mg by mouth 2 (two) times daily with a meal.     CORRECTOL PO  Take 1 capsule by mouth at bedtime.     D3 HIGH POTENCY 1000 units capsule  Generic drug:  Cholecalciferol  Take 1,000 Units by mouth daily.     enoxaparin 40 MG/0.4ML injection  Commonly known as:  LOVENOX  Inject 0.4 mLs (40 mg total) into the skin daily.     escitalopram 10 MG tablet  Commonly known as:   LEXAPRO  Take 10 mg by mouth at bedtime.     fenofibrate micronized 67 MG capsule  Commonly known as:  LOFIBRA  Take 67 mg by mouth daily before breakfast.     gabapentin 100 MG capsule  Commonly known as:  NEURONTIN  Take 100 mg by mouth 3 (three) times daily.     Ginseng 100 MG Caps  Take 1 capsule by mouth every morning.     Glucosamine Sulfate 1000 MG Caps  Take 2 capsules by mouth every morning.     loratadine 10 MG tablet  Commonly known as:  CLARITIN  Take 10 mg by mouth daily.     NIFEdipine 30 MG 24 hr tablet  Commonly known as:  PROCARDIA-XL/ADALAT CC  Take 30 mg by mouth daily.     oxyCODONE 5 MG immediate release tablet  Commonly known as:  Oxy IR/ROXICODONE  Take 1-2 tablets (5-10 mg total) by  mouth every 4 (four) hours as needed for severe pain or breakthrough pain.     PROBIOTIC DAILY PO  Take 1 capsule by mouth at bedtime.     traMADol 50 MG tablet  Commonly known as:  ULTRAM  Take 1-2 tablets (50-100 mg total) by mouth every 4 (four) hours as needed for moderate pain.     TYLENOL ARTHRITIS PAIN PO  Take 2 capsules by mouth at bedtime.     vitamin E 400 UNIT capsule  Take 400 Units by mouth daily.     WOMENS DAILY FORMULA PO  Take 1 capsule by mouth every morning.        Diagnostic Studies: Dg Knee Right Port  02/26/2016  CLINICAL DATA:  Postop right total knee arthroplasty. EXAM: PORTABLE RIGHT KNEE - 1-2 VIEW COMPARISON:  Knee MRI 07/25/2015. FINDINGS: The patient has undergone interval right total knee arthroplasty. The hardware appears well positioned. Two surgical drains are in place. A small amount of air is present within the joint and soft tissues surrounding the knee. There is no evidence of acute fracture or subluxation. IMPRESSION: No demonstrated complication related to right total knee arthroplasty. Electronically Signed   By: Richardean Sale M.D.   On: 02/26/2016 11:33   Mm Digital Screening Unilat L  01/30/2016  CLINICAL DATA:   Screening. EXAM: DIGITAL SCREENING UNILATERAL LEFT MAMMOGRAM WITH CAD COMPARISON:  Previous exam(s). ACR Breast Density Category c: The breast tissue is heterogeneously dense, which may obscure small masses. FINDINGS: The patient has had a right mastectomy. There are no findings suspicious for malignancy. Images were processed with CAD. IMPRESSION: No mammographic evidence of malignancy. A result letter of this screening mammogram will be mailed directly to the patient. RECOMMENDATION: Screening mammogram in one year.  (Code:SM-L-40M) BI-RADS CATEGORY  1: Negative. Electronically Signed   By: Pamelia Hoit M.D.   On: 01/30/2016 10:18    Disposition: 01-Home or Self Care      Discharge Instructions    Diet - low sodium heart healthy    Complete by:  As directed      Increase activity slowly    Complete by:  As directed            Follow-up Information    Follow up with [facility] R., PA On 03/12/2016.   Specialty:  Physician Assistant   Why:  at 9:15am   Contact information:   [address] [facility] [place] [address] [phone]       Follow up with Dereck Leep, MD On 04/09/2016.   Specialty:  Orthopedic Surgery   Why:  at 9:45am   Contact information:   [place] [address] [phone]        Signed: Watt Climes 02/28/2016, 7:35 AM

## 2016-02-28 NOTE — Progress Notes (Signed)
Occupational Therapy Treatment Patient Details Name: Veronica Hoffman MRN: [id number] DOB: 1941/09/23 Today's Date: 02/28/2016    History of present illness Patient is a 75 y/o female admitted for R TKR on 02/26/2016   OT comments  Pt. is a 74 y.o. female who was admitted for a RTKR on 02/26/2016. Pt. Presents with pain, weakness, and limited functioinal mobility which hinders her ability to complete ADL/IADL tasks. Pt. Was able to demonstrate minA for A/E use. Pt. Is planning to discharge home today.   Follow Up Recommendations       Equipment Recommendations       Recommendations for Other Services      Precautions / Restrictions Precautions Precautions: Fall Required Braces or Orthoses: Knee Immobilizer - Right Knee Immobilizer - Right: Discontinue once straight leg raise with < 10 degree lag (Remaind unable to do SLR independantly) Restrictions Weight Bearing Restrictions: Yes RLE Weight Bearing: Weight bearing as tolerated       Mobility   Balance    ADL                                         General ADL Comments: Pt. education was provided about A/E use for LE dressing. pt. completed with MinA. Pt. ed was provided about home set-up, DME, and anticipated IADL needs.      Vision                     Perception     Praxis      Cognition                       Extremity/Trunk Assessment               Exercises   Shoulder Instructions       General Comments      Pertinent Vitals/ Pain       Pain Assessment: 0-10 Pain Score: 4  Faces Pain Scale: Hurts little more Pain Location: R knee Pain Descriptors / Indicators: Aching Pain Intervention(s): Limited activity within patient's tolerance  Home Living                                          Prior Functioning/Environment              Frequency       Progress Toward Goals  OT Goals(current goals can now be found in the care plan  section)     Acute Rehab OT Goals Patient Stated Goal: To line dance again.  Plan      Co-evaluation                 End of Session     Activity Tolerance     Patient Left     Nurse Communication          Time: [id number] OT Time Calculation (min): 23 min  Charges: OT General Charges $OT Visit: 1 Procedure OT Treatments $Self Care/Home Management : 23-37 mins   Harrel Carina, MS, OTR/L    Harrel Carina 02/28/2016, 12:15 PM

## 2016-02-28 NOTE — Progress Notes (Signed)
Physical Therapy Treatment Patient Details Name: Veronica Hoffman MRN: [id number] DOB: 1941-10-13 Today's Date: 02/28/2016    History of Present Illness Patient is a 75 y/o female admitted for R TKR on 02/26/2016    PT Comments    Pt awake and ready for session.  Participated in supine and seated exercises as described below.  She remains unable to do and independent straight leg raise per orders without extension lag so knee immobilizer was used.  Pt educated on use and how to don/doff brace with proper position.  She was able to ambulate around nursing unit and complete stair training.  Pt reports feeling comfortable with discharge plan.   Follow Up Recommendations  Home health PT     Equipment Recommendations  Rolling walker with 5" wheels    Recommendations for Other Services       Precautions / Restrictions Precautions Precautions: Fall Required Braces or Orthoses: Knee Immobilizer - Right Knee Immobilizer - Right: Discontinue once straight leg raise with < 10 degree lag (Remaind unable to do SLR independantly) Restrictions Weight Bearing Restrictions: Yes RLE Weight Bearing: Weight bearing as tolerated    Mobility  Bed Mobility Overal bed mobility: Needs Assistance Bed Mobility: Supine to Sit;Sit to Supine     Supine to sit: Min assist Sit to supine: Modified independent (Device/Increase time)   General bed mobility comments: needs assist at times for RLE management  Transfers Overall transfer level: Needs assistance Equipment used: Rolling walker (2 wheeled) Transfers: Sit to/from Stand Sit to Stand: Min guard Stand pivot transfers: Min guard       General transfer comment: Cues for hand placement, anterior weight shifting and feet positioning.  Ambulation/Gait Ambulation/Gait assistance: Min guard Ambulation Distance (Feet): 225 Feet Assistive device: Rolling walker (2 wheeled) Gait Pattern/deviations: Step-to pattern Gait velocity: decreased Gait  velocity interpretation: <1.8 ft/sec, indicative of risk for recurrent falls General Gait Details: Pt demonstrated significantly improved gait with fair speed and good stability with no LOB. With cues for increased step length, heel to toe and knee extension pt was able to perform smooth gait pattern.    Stairs Stairs: Yes Stairs assistance: Min guard Stair Management: Two rails;Step to pattern Number of Stairs: 4 General stair comments: did well.  had one lob at bottom of stairs but recovered without assist  Wheelchair Mobility    Modified Rankin (Stroke Patients Only)       Balance Overall balance assessment: Needs assistance Sitting-balance support: No upper extremity supported Sitting balance-Leahy Scale: Good     Standing balance support: Bilateral upper extremity supported Standing balance-Leahy Scale: Good Standing balance comment: steady on level surfaces                    Cognition Arousal/Alertness: Awake/alert Behavior During Therapy: WFL for tasks assessed/performed;Anxious Overall Cognitive Status: Within Functional Limits for tasks assessed                      Exercises Total Joint Exercises Ankle Circles/Pumps: 20 reps;Supine;Both;AROM Quad Sets: AROM;Right;20 reps;Supine Heel Slides: AAROM;Right;20 reps;Supine Hip ABduction/ADduction: AAROM;Right;20 reps;Supine Straight Leg Raises: AAROM;Right;20 reps;Supine Long Arc Quad: AAROM;Right;20 reps;Seated Knee Flexion: AAROM;Right;20 reps;Seated Goniometric ROM: 3-75 AAROM    General Comments        Pertinent Vitals/Pain Pain Assessment: 0-10 Pain Score: 4  Pain Location: R knee Pain Descriptors / Indicators: Aching Pain Intervention(s): Limited activity within patient's tolerance    Home Living  Prior Function            PT Goals (current goals can now be found in the care plan section) Acute Rehab PT Goals Patient Stated Goal: To line dance  again. Time For Goal Achievement: 03/11/16 Potential to Achieve Goals: Good Progress towards PT goals: Progressing toward goals    Frequency  BID    PT Plan Current plan remains appropriate    Co-evaluation             End of Session Equipment Utilized During Treatment: Gait belt Activity Tolerance: Patient tolerated treatment well Patient left: in chair;with call bell/phone within reach;with chair alarm set     Time: 1000-1045 PT Time Calculation (min) (ACUTE ONLY): 45 min  Charges:  $Gait Training: 8-22 mins $Therapeutic Exercise: 23-37 mins $Therapeutic Activity: 38-52 mins                    G Codes:      Chesley Noon, PTA 02/28/2016, 10:54 AM

## 2016-02-28 NOTE — Care Management Important Message (Signed)
Important Message  Patient Details  Name: DESMOND SCHUKNECHT MRN: [id number] Date of Birth: [birth year]   Medicare Important Message Given:  Yes    Emony Dormer A, RN 02/28/2016, 7:40 AM

## 2016-02-28 NOTE — Discharge Instructions (Signed)

## 2016-02-28 NOTE — Progress Notes (Signed)
   Subjective: 2 Days Post-Op Procedure(s) (LRB): COMPUTER ASSISTED TOTAL KNEE ARTHROPLASTY (Right) Patient reports pain as mild.   Patient is well, and has had no acute complaints or problems Continue with physical therapy today.  Plan is to go Home after hospital stay. no nausea and no vomiting Patient denies any chest pains or shortness of breath. Objective: Vital signs in last 24 hours: Temp:  [97.8 F (36.6 C)-98.4 F (36.9 C)] 98.4 F (36.9 C) (04/28 0437) Pulse Rate:  [103-110] 109 (04/28 0437) Resp:  [18] 18 (04/28 0437) BP: (139-158)/(64-72) 158/64 mmHg (04/28 0437) SpO2:  [97 %-99 %] 97 % (04/28 0437) well approximated incision Heels are non tender and elevated off the bed using rolled towels Intake/Output from previous day: 04/27 0701 - 04/28 0700 In: 2026.7 [P.O.:480; I.V.:1546.7] Out: 1845 [Urine:1675; Drains:170] Intake/Output this shift:     Recent Labs  02/27/16 0411 02/28/16 0437  HGB 11.7* 11.7*    Recent Labs  02/27/16 0411 02/28/16 0437  WBC 11.1* 11.5*  RBC 3.79* 3.70*  HCT 35.5 34.5*  PLT 247 286    Recent Labs  02/27/16 0411 02/28/16 0437  NA 132* 138  K 4.0 3.5  CL 99* 101  CO2 25 28  BUN 12 11  CREATININE 0.86 0.84  GLUCOSE 122* 121*  CALCIUM 8.2* 8.9   No results for input(s): LABPT, INR in the last 72 hours.  EXAM General - Patient is Alert, Appropriate and Oriented Extremity - Neurologically intact Neurovascular intact Sensation intact distally Intact pulses distally Dorsiflexion/Plantar flexion intact Dressing - dressing C/D/I Motor Function - intact, moving foot and toes well on exam.    Past Medical History  Diagnosis Date  . Breast cancer ([place]) 06/25/86    rt mastecomy/chemo  and rad  . Elevated cholesterol   . Raynaud disease   . Spastic colon   . Arthritis   . Neuropathy ([place])   . Complication of anesthesia     occ nausea  . Pulmonary hypertension ([place])   . Scleroderma ([place])   . Deviated septum      Assessment/Plan: 2 Days Post-Op Procedure(s) (LRB): COMPUTER ASSISTED TOTAL KNEE ARTHROPLASTY (Right) Active Problems:   S/P total knee arthroplasty  Estimated body mass index is 27.05 kg/(m^2) as calculated from the following:   Height as of this encounter: 5' 7.5" (1.715 m).   Weight as of this encounter: 79.561 kg (175 lb 6.4 oz). Up with therapy Discharge home with home health after pt has a bowel movement and does steps  Labs: were reviewed DVT Prophylaxis - Lovenox, Foot Pumps and TED hose Weight-Bearing as tolerated to right leg Hemovac d/c'd Needs a bowel movement today  Aleigha Gilani R. [place] 02/28/2016, 7:26 AM

## 2016-02-28 NOTE — Progress Notes (Signed)
Patient is being discharged today to home with [facility]. Discharge and RX instructions given and patient acknowledged understanding. IV's removed and belongings packed. Patient's brother is coming to pick her up to take her home.

## 2016-06-08 DIAGNOSIS — K219 Gastro-esophageal reflux disease without esophagitis: Secondary | ICD-10-CM | POA: Insufficient documentation

## 2016-10-14 ENCOUNTER — Encounter: Payer: Self-pay | Admitting: *Deleted

## 2016-10-14 ENCOUNTER — Other Ambulatory Visit: Payer: Self-pay | Admitting: *Deleted

## 2016-10-14 ENCOUNTER — Encounter: Payer: Self-pay | Admitting: Podiatry

## 2016-10-14 ENCOUNTER — Ambulatory Visit (INDEPENDENT_AMBULATORY_CARE_PROVIDER_SITE_OTHER): Payer: Medicare Other | Admitting: Podiatry

## 2016-10-14 DIAGNOSIS — G629 Polyneuropathy, unspecified: Secondary | ICD-10-CM | POA: Diagnosis not present

## 2016-10-14 MED ORDER — GABAPENTIN 100 MG PO CAPS: ORAL_CAPSULE | ORAL | 3 refills | Status: DC

## 2016-10-14 NOTE — Progress Notes (Signed)
   Subjective:    Patient ID: Veronica Hoffman, female    DOB: [birth year], 75 y.o.   MRN: [id number]  HPI: She presents today with a concern that her feet are so read. She says that they burn and she and they burn in the toes burn at night while there under the collar and she has to sleep with them outside of the cover because they're so. She states now that started turning red. She's been told that she has neuropathy as well as renal disease. She states the dancing and ice to help. She takes 100 mg of gabapentin 3 times a day which really doesn't do much for her she says. She has a history of bilateral breast cancer with bilateral mastectomies and 4 rounds of chemotherapy and radiation.    Review of Systems  HENT: Positive for sinus pressure.   Musculoskeletal: Positive for arthralgias and back pain.  Skin: Positive for color change.  All other systems reviewed and are negative.      Objective:   Physical Exam: Vital signs are stable she is alert and oriented 3. Pulses are palpable. Neurologic system is intact. She has a slight decrease in vibratory sensation and proprioception. Her feet are cool to the touch and a currently read but blanch white with pressure. Orthopedic evaluation demonstrates mild Leksell hammertoe deformities. Prominent fifth metatarsal head and base bilaterally. No open lesions or wounds are noted.        Assessment & Plan:  Neuropathy associated chemotherapy and Raynaud's disease.  Plan: I increased her gabapentin 200 mg 3 times a day. May need to increase soon. I will follow-up with her in 1 month

## 2016-11-18 ENCOUNTER — Ambulatory Visit: Payer: Medicare Other | Admitting: Podiatry

## 2016-11-30 ENCOUNTER — Ambulatory Visit (INDEPENDENT_AMBULATORY_CARE_PROVIDER_SITE_OTHER): Payer: Medicare Other | Admitting: Podiatry

## 2016-11-30 DIAGNOSIS — G629 Polyneuropathy, unspecified: Secondary | ICD-10-CM

## 2016-12-01 NOTE — Progress Notes (Signed)
She presents today for follow-up of her neuropathy and states that she doesn't feel that the gabapentin has been working for her and it has caused her to swell considerably. She has backed down off of the higher dose only taking 200 mg twice daily.  Objective: Vital signs are stable alert and oriented 3. Pulses are palpable. Mild edema bilateral lower extremity pulses remain palpable with no signs of infection or open lesions or wounds.  Assessment: Continue neuropathy bilateral.  Plan: Taper over the next 2 weeks with the gabapentin. All of her off of this medicine. We will write a prescription for a topical compounding agent. Follow up with her in a month or 2.

## 2016-12-11 ENCOUNTER — Other Ambulatory Visit: Payer: Self-pay | Admitting: Family Medicine

## 2016-12-11 DIAGNOSIS — Z1231 Encounter for screening mammogram for malignant neoplasm of breast: Secondary | ICD-10-CM

## 2017-02-01 ENCOUNTER — Ambulatory Visit: Payer: Medicare Other | Admitting: Podiatry

## 2017-02-01 ENCOUNTER — Ambulatory Visit
Admission: RE | Admit: 2017-02-01 | Discharge: 2017-02-01 | Disposition: A | Discharge status: Home / Self Care | Payer: Medicare Other | Source: Ambulatory Visit | Attending: Family Medicine | Admitting: Family Medicine

## 2017-02-01 ENCOUNTER — Other Ambulatory Visit: Payer: Self-pay | Admitting: Family Medicine

## 2017-02-01 DIAGNOSIS — Z1231 Encounter for screening mammogram for malignant neoplasm of breast: Secondary | ICD-10-CM | POA: Insufficient documentation

## 2017-02-01 HISTORY — DX: Personal history of irradiation: Z92.3

## 2017-02-01 HISTORY — DX: Personal history of antineoplastic chemotherapy: Z92.21

## 2017-02-08 ENCOUNTER — Ambulatory Visit: Payer: Medicare Other | Admitting: Podiatry

## 2017-02-08 ENCOUNTER — Encounter: Payer: Self-pay | Admitting: Podiatry

## 2017-02-08 ENCOUNTER — Ambulatory Visit (INDEPENDENT_AMBULATORY_CARE_PROVIDER_SITE_OTHER): Payer: Medicare Other | Admitting: Podiatry

## 2017-02-08 DIAGNOSIS — G629 Polyneuropathy, unspecified: Secondary | ICD-10-CM

## 2017-02-08 NOTE — Progress Notes (Signed)
She presents today for follow-up of her gabapentin. She has discontinued the gabapentin because of the side effects states that her feet really don't feel any better or any worse. She states that the red hot swollen and painful all the time. She was unable to utilize the topical analgesic because of burning pain.  Objective: No change in physical exam.  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  Plan: Referred her back to her primary doctor.

## 2017-05-03 ENCOUNTER — Ambulatory Visit (INDEPENDENT_AMBULATORY_CARE_PROVIDER_SITE_OTHER): Payer: Medicare Other | Admitting: Vascular Surgery

## 2017-05-03 ENCOUNTER — Encounter (INDEPENDENT_AMBULATORY_CARE_PROVIDER_SITE_OTHER): Payer: Self-pay | Admitting: Vascular Surgery

## 2017-05-03 VITALS — BP 152/74 | HR 95 | Resp 16 | Ht 67.0 in | Wt 167.0 lb

## 2017-05-03 DIAGNOSIS — M79604 Pain in right leg: Secondary | ICD-10-CM

## 2017-05-03 DIAGNOSIS — M349 Systemic sclerosis, unspecified: Secondary | ICD-10-CM | POA: Diagnosis not present

## 2017-05-03 DIAGNOSIS — I872 Venous insufficiency (chronic) (peripheral): Secondary | ICD-10-CM

## 2017-05-03 DIAGNOSIS — I7300 Raynaud's syndrome without gangrene: Secondary | ICD-10-CM | POA: Diagnosis not present

## 2017-05-03 DIAGNOSIS — E7800 Pure hypercholesterolemia, unspecified: Secondary | ICD-10-CM | POA: Diagnosis not present

## 2017-05-03 DIAGNOSIS — I739 Peripheral vascular disease, unspecified: Secondary | ICD-10-CM

## 2017-05-03 DIAGNOSIS — M79605 Pain in left leg: Secondary | ICD-10-CM

## 2017-05-04 DIAGNOSIS — I739 Peripheral vascular disease, unspecified: Secondary | ICD-10-CM | POA: Insufficient documentation

## 2017-05-04 DIAGNOSIS — I872 Venous insufficiency (chronic) (peripheral): Secondary | ICD-10-CM | POA: Insufficient documentation

## 2017-05-04 DIAGNOSIS — M79609 Pain in unspecified limb: Secondary | ICD-10-CM | POA: Insufficient documentation

## 2017-05-04 NOTE — Progress Notes (Signed)
MRN : [id number]  Veronica Hoffman is a 76 y.o. (04/11/1941) female who presents with chief complaint of  Chief Complaint  Patient presents with  . New Evaluation    Claudication  .  History of Present Illness: The patient is seen for evaluation of painful lower extremities. Patient notes the pain is variable and not always associated with activity.  The pain is somewhat consistent day to day occurring on most days. The patient notes the pain also occurs with standing and routinely seems worse as the day wears on. The pain has been progressive over the past several years. The patient states these symptoms are causing  a profound negative impact on quality of life and daily activities.  She has a history of scleroderma and follows with Dr. Jefm Bryant. He is also identified the nodes changes in her hands in the past.  The patient denies rest pain or dangling of an extremity off the side of the bed during the night for relief. No open wounds or sores at this time. No history of DVT or phlebitis. No prior interventions or surgeries.  There is a  history of back problems and DJD of the lumbar and sacral spine.   Current Meds  Medication Sig  . Acetaminophen (TYLENOL ARTHRITIS PAIN PO) Take 2 capsules by mouth at bedtime.  Marland Kitchen atorvastatin (LIPITOR) 40 MG tablet Take 40 mg by mouth daily at 6 PM.   . Bisacodyl (CORRECTOL PO) Take 1 capsule by mouth at bedtime.  . calcium carbonate (OS-CAL) 600 MG TABS tablet Take 600 mg by mouth 2 (two) times daily with a meal.  . Cholecalciferol (D3 HIGH POTENCY) 1000 UNITS capsule Take 1,000 Units by mouth daily.  . Cyanocobalamin (B-12) 2500 MCG TABS Take 1 capsule by mouth every morning.  . enoxaparin (LOVENOX) 40 MG/0.4ML injection Inject 0.4 mLs (40 mg total) into the skin daily.  Marland Kitchen escitalopram (LEXAPRO) 10 MG tablet Take 10 mg by mouth at bedtime.  . fenofibrate micronized (LOFIBRA) 67 MG capsule Take 67 mg by mouth daily before breakfast.  .  gabapentin (NEURONTIN) 100 MG capsule Take 2 capsules at breakfast, 2 at lunch and 2 at bedtime  . Ginseng 100 MG CAPS Take 1 capsule by mouth every morning.  . Glucosamine Sulfate 1000 MG CAPS Take 2 capsules by mouth every morning.  . loratadine (CLARITIN) 10 MG tablet Take 10 mg by mouth daily.  . Multiple Vitamins-Minerals (WOMENS DAILY FORMULA PO) Take 1 capsule by mouth every morning.  Marland Kitchen NIFEdipine (PROCARDIA-XL/ADALAT CC) 30 MG 24 hr tablet Take 30 mg by mouth daily.  . NONFORMULARY OR COMPOUNDED [place]  Combination Pain Cream -  Baclofen 2%, Doxepin 5%, Gabapentin 6%, Topiramate 2%, Pentoxifylline 3% Apply 1-2 grams to affected area 3-4 times daily Qty. 120 gm  3 refills  . Probiotic Product (PROBIOTIC DAILY PO) Take 1 capsule by mouth at bedtime.  . traMADol (ULTRAM) 50 MG tablet Take 1-2 tablets (50-100 mg total) by mouth every 4 (four) hours as needed for moderate pain.  . vitamin E 400 UNIT capsule Take 400 Units by mouth daily.    Past Medical History:  Diagnosis Date  . Arthritis   . Breast cancer ([place]) 06/25/86   rt mastecomy/chemo  and rad  . Complication of anesthesia    occ nausea  . Deviated septum   . Elevated cholesterol   . Neuropathy   . Personal history of chemotherapy   . Personal history of radiation therapy   .  Pulmonary hypertension ([place])   . Raynaud disease   . Scleroderma ([place])   . Spastic colon     Past Surgical History:  Procedure Laterality Date  . APPENDECTOMY    . CARDIAC CATHETERIZATION N/A 06/07/2015   Procedure: Right Heart Cath;  Surgeon: Teodoro Spray, MD;  Location: [facility];  Service: Cardiovascular;  Laterality: N/A;  . CATARACT EXTRACTION Bilateral   . KNEE ARTHROPLASTY Right 02/26/2016   Procedure: COMPUTER ASSISTED TOTAL KNEE ARTHROPLASTY;  Surgeon: Dereck Leep, MD;  Location: [facility];  Service: Orthopedics;  Laterality: Right;  . MASTECTOMY Right 06/25/86   had chemo/rad  . NASAL SEPTUM SURGERY        Social History Social History  Substance Use Topics  . Smoking status: Never Smoker  . Smokeless tobacco: Never Used  . Alcohol use No    Family History Family History  Problem Relation Age of Onset  . Breast cancer Maternal Aunt        mat great aunt  . Breast cancer Paternal Aunt   No family history of bleeding/clotting disorders, porphyria or CVA  Allergies  Allergen Reactions  . Gabapentin Swelling    Redness  . Aspirin Other (See Comments)    GI Bleed  . Boniva [Ibandronic Acid] Nausea And Vomiting  . Codeine Nausea And Vomiting  . Demerol [Meperidine] Nausea And Vomiting  . Fosamax [Alendronate] Nausea And Vomiting  . Norvasc [Amlodipine] Other (See Comments)    unknown  . Propoxyphene Nausea And Vomiting     REVIEW OF SYSTEMS (Negative unless checked)  Constitutional: [] Weight loss  [] Fever  [] Chills Cardiac: [] Chest pain   [] Chest pressure   [] Palpitations   [] Shortness of breath when laying flat   [] Shortness of breath with exertion. Vascular:  [x] Pain in legs with walking   [x] Pain in legs at rest  [] History of DVT   [] Phlebitis   [x] Swelling in legs   [] Varicose veins   [] Non-healing ulcers Pulmonary:   [] Uses home oxygen   [] Productive cough   [] Hemoptysis   [] Wheeze  [] COPD   [] Asthma Neurologic:  [] Dizziness   [] Seizures   [] History of stroke   [] History of TIA  [] Aphasia   [] Vissual changes   [] Weakness or numbness in arm   [] Weakness or numbness in leg Musculoskeletal:   [] Joint swelling   [x] Joint pain   [x] Low back pain Hematologic:  [] Easy bruising  [] Easy bleeding   [] Hypercoagulable state   [] Anemic Gastrointestinal:  [] Diarrhea   [] Vomiting  [] Gastroesophageal reflux/heartburn   [] Difficulty swallowing. Genitourinary:  [] Chronic kidney disease   [] Difficult urination  [] Frequent urination   [] Blood in urine Skin:  [] Rashes   [] Ulcers  Psychological:  [] History of anxiety   []  History of major depression.  Physical Examination  Vitals:    05/03/17 1404  BP: (!) 152/74  Pulse: 95  Resp: 16  Weight: 167 lb (75.8 kg)  Height: 5\' 7"  (1.702 m)   Body mass index is 26.16 kg/m. Gen: WD/WN, NAD Head: [place]/AT, No temporalis wasting.  Ear/Nose/Throat: Hearing grossly intact, nares w/o erythema or drainage, poor dentition Eyes: PER, EOMI, sclera nonicteric.  Neck: Supple, no masses.  No bruit or JVD.  Pulmonary:  Good air movement, clear to auscultation bilaterally, no use of accessory muscles.  Cardiac: RRR, normal S1, S2, no Murmurs. Vascular: Cyanosis of the fingers as well as the toes is noted. Mild to moderate venous stasis changes identified in the ankles bilaterally. There is 2+ pitting edema noted bilaterally.  Vessel Right Left  Radial Palpable Palpable  PT Palpable Palpable  DP Palpable Palpable  Gastrointestinal: soft, non-distended. No guarding/no peritoneal signs.  Musculoskeletal: M/S 5/5 throughout.  No deformity or atrophy.  Neurologic: CN 2-12 intact. Pain and light touch intact in extremities.  Symmetrical.  Speech is fluent. Motor exam as listed above. Psychiatric: Judgment intact, Mood & affect appropriate for pt's clinical situation. Dermatologic: Raynaud's changes noted with venous dermatitis identified no ulcers noted.  No changes consistent with cellulitis. Lymph : No Cervical lymphadenopathy, no lichenification or skin changes of chronic lymphedema.  CBC Lab Results  Component Value Date   WBC 11.5 (H) 02/28/2016   HGB 11.7 (L) 02/28/2016   HCT 34.5 (L) 02/28/2016   MCV 93.2 02/28/2016   PLT 286 02/28/2016    BMET    Component Value Date/Time   NA 138 02/28/2016 0437   NA 135 01/31/2015 1024   K 3.5 02/28/2016 0437   K 4.6 01/31/2015 1024   CL 101 02/28/2016 0437   CL 101 01/31/2015 1024   CO2 28 02/28/2016 0437   CO2 31 01/31/2015 1024   GLUCOSE 121 (H) 02/28/2016 0437   GLUCOSE 114 (H) 01/31/2015 1024   BUN 11 02/28/2016 0437   BUN 18 01/31/2015 1024   CREATININE 0.84 02/28/2016 0437     CREATININE 0.93 01/31/2015 1024   CALCIUM 8.9 02/28/2016 0437   CALCIUM 10.2 01/31/2015 1024   GFRNONAA >60 02/28/2016 0437   GFRNONAA >60 01/31/2015 1024   GFRAA >60 02/28/2016 0437   GFRAA >60 01/31/2015 1024   CrCl cannot be calculated (Patient's most recent lab result is older than the maximum 21 days allowed.).  COAG Lab Results  Component Value Date   INR 0.89 02/12/2016    Radiology No results found.  Assessment/Plan 1. Pain in both lower extremities  Recommend:  The patient has atypical pain symptoms for pure atherosclerotic disease. However, on physical exam there is evidence of mixed venous and arterial disease, given the diminished pulses and the edema associated with venous changes of the legs.  Noninvasive studies including ABI's and venous ultrasound of the legs will be obtained and the patient will follow up with me to review these studies.  The patient should continue walking and begin a more formal exercise program. The patient should continue his antiplatelet therapy and aggressive treatment of the lipid abnormalities.  The patient should begin wearing graduated compression socks 15-20 mmHg strength to control edema.   - VAS [place] ABI WITH/WO TBI; Future - VAS [place] LOWER EXTREMITY VENOUS REFLUX; Future  2. Raynaud's disease without gangrene The patient's Raynaud's is better and her symptoms are stable.  Behavioral modification was stressed; avoidance of cold and utilizing wool socks was reviewed again.  Norvasc 5 mg po daily can be added if needed  The patient will follow up after the ultrasounds  3. PAD (peripheral artery disease) ([place]) See #1 - VAS [place] ABI WITH/WO TBI; Future  4. Chronic venous insufficiency See #1 - VAS [place] LOWER EXTREMITY VENOUS REFLUX; Future  5. Scleroderma ([place]) She will continue follow-up as arranged with Dr. Sammuel Bailiff  6. Hypercholesterolemia Continue statin as ordered and reviewed, no changes at this  time     Hortencia Pilar, MD  05/04/2017 10:12 AM

## 2017-05-19 DIAGNOSIS — G629 Polyneuropathy, unspecified: Secondary | ICD-10-CM | POA: Insufficient documentation

## 2017-07-12 ENCOUNTER — Ambulatory Visit (INDEPENDENT_AMBULATORY_CARE_PROVIDER_SITE_OTHER): Payer: Medicare Other

## 2017-07-12 ENCOUNTER — Encounter (INDEPENDENT_AMBULATORY_CARE_PROVIDER_SITE_OTHER): Payer: Self-pay | Admitting: Vascular Surgery

## 2017-07-12 ENCOUNTER — Ambulatory Visit (INDEPENDENT_AMBULATORY_CARE_PROVIDER_SITE_OTHER): Payer: Medicare Other | Admitting: Vascular Surgery

## 2017-07-12 VITALS — BP 146/74 | HR 88 | Resp 14 | Ht 67.0 in | Wt 166.0 lb

## 2017-07-12 DIAGNOSIS — I739 Peripheral vascular disease, unspecified: Secondary | ICD-10-CM

## 2017-07-12 DIAGNOSIS — M79605 Pain in left leg: Secondary | ICD-10-CM

## 2017-07-12 DIAGNOSIS — I872 Venous insufficiency (chronic) (peripheral): Secondary | ICD-10-CM | POA: Diagnosis not present

## 2017-07-12 DIAGNOSIS — I7300 Raynaud's syndrome without gangrene: Secondary | ICD-10-CM | POA: Diagnosis not present

## 2017-07-12 DIAGNOSIS — M79604 Pain in right leg: Secondary | ICD-10-CM

## 2017-07-12 DIAGNOSIS — E7800 Pure hypercholesterolemia, unspecified: Secondary | ICD-10-CM

## 2017-07-17 NOTE — Progress Notes (Signed)
MRN : [id number]  KHALIYA GOLINSKI is a 76 y.o. ([birth year]) female who presents with chief complaint of  Chief Complaint  Patient presents with  . Follow-up    Follow up ultrasound  .  History of Present Illness: The patient returns to the office for followup evaluation regarding leg swelling.  The swelling has improved quite a bit and the pain associated with swelling has decreased substantially. There have not been any interval development of a ulcerations or wounds.  Since the previous visit the patient has been wearing graduated compression stockings and has noted little significant improvement in the lymphedema. The patient has been using compression routinely morning until night.  The patient also states elevation during the day and exercise is being done too.  Venous ultrasound shows superficial reflux bilaterally and a large baker's cyst behind the left knee   Current Meds  Medication Sig  . Acetaminophen (TYLENOL ARTHRITIS PAIN PO) Take 2 capsules by mouth at bedtime.  Marland Kitchen atorvastatin (LIPITOR) 40 MG tablet Take 40 mg by mouth daily at 6 PM.   . Bisacodyl (CORRECTOL PO) Take 1 capsule by mouth at bedtime.  . calcium carbonate (OS-CAL) 600 MG TABS tablet Take 600 mg by mouth 2 (two) times daily with a meal.  . Cholecalciferol (D3 HIGH POTENCY) 1000 UNITS capsule Take 1,000 Units by mouth daily.  . Cyanocobalamin (B-12) 2500 MCG TABS Take 1 capsule by mouth every morning.  . enoxaparin (LOVENOX) 40 MG/0.4ML injection Inject 0.4 mLs (40 mg total) into the skin daily.  Marland Kitchen escitalopram (LEXAPRO) 10 MG tablet Take 10 mg by mouth at bedtime.  . fenofibrate micronized (LOFIBRA) 67 MG capsule Take 67 mg by mouth daily before breakfast.  . Ginseng 100 MG CAPS Take 1 capsule by mouth every morning.  . Glucosamine Sulfate 1000 MG CAPS Take 2 capsules by mouth every morning.  . loratadine (CLARITIN) 10 MG tablet Take 10 mg by mouth daily.  . Multiple Vitamins-Minerals (WOMENS DAILY  FORMULA PO) Take 1 capsule by mouth every morning.  Marland Kitchen NIFEdipine (PROCARDIA-XL/ADALAT CC) 30 MG 24 hr tablet Take 30 mg by mouth daily.  . NONFORMULARY OR COMPOUNDED [place]  Combination Pain Cream -  Baclofen 2%, Doxepin 5%, Gabapentin 6%, Topiramate 2%, Pentoxifylline 3% Apply 1-2 grams to affected area 3-4 times daily Qty. 120 gm  3 refills  . Probiotic Product (PROBIOTIC DAILY PO) Take 1 capsule by mouth at bedtime.  . traMADol (ULTRAM) 50 MG tablet Take 1-2 tablets (50-100 mg total) by mouth every 4 (four) hours as needed for moderate pain.  . vitamin E 400 UNIT capsule Take 400 Units by mouth daily.    Past Medical History:  Diagnosis Date  . Arthritis   . Breast cancer ([place]) 06/25/86   rt mastecomy/chemo  and rad  . Complication of anesthesia    occ nausea  . Deviated septum   . Elevated cholesterol   . Neuropathy   . Personal history of chemotherapy   . Personal history of radiation therapy   . Pulmonary hypertension ([place])   . Raynaud disease   . Scleroderma ([place])   . Spastic colon     Past Surgical History:  Procedure Laterality Date  . APPENDECTOMY    . CARDIAC CATHETERIZATION N/A 06/07/2015   Procedure: Right Heart Cath;  Surgeon: Teodoro Spray, MD;  Location: [place] [facility];  Service: Cardiovascular;  Laterality: N/A;  . CATARACT EXTRACTION Bilateral   . KNEE ARTHROPLASTY Right 02/26/2016  Procedure: COMPUTER ASSISTED TOTAL KNEE ARTHROPLASTY;  Surgeon: Dereck Leep, MD;  Location: [facility];  Service: Orthopedics;  Laterality: Right;  . MASTECTOMY Right 06/25/86   had chemo/rad  . NASAL SEPTUM SURGERY      Social History Social History  Substance Use Topics  . Smoking status: Never Smoker  . Smokeless tobacco: Never Used  . Alcohol use No    Family History Family History  Problem Relation Age of Onset  . Breast cancer Maternal Aunt        mat great aunt  . Breast cancer Paternal Aunt     Allergies  Allergen Reactions  .  Gabapentin Swelling    Redness  . Aspirin Other (See Comments)    GI Bleed  . Boniva [Ibandronic Acid] Nausea And Vomiting  . Codeine Nausea And Vomiting  . Demerol [Meperidine] Nausea And Vomiting  . Fosamax [Alendronate] Nausea And Vomiting  . Norvasc [Amlodipine] Other (See Comments)    unknown  . Propoxyphene Nausea And Vomiting     REVIEW OF SYSTEMS (Negative unless checked)  Constitutional: [] Weight loss  [] Fever  [] Chills Cardiac: [] Chest pain   [] Chest pressure   [] Palpitations   [] Shortness of breath when laying flat   [] Shortness of breath with exertion. Vascular:  [] Pain in legs with walking   [x] Pain in legs with standing  [] History of DVT   [] Phlebitis   [x] Swelling in legs   [x] Varicose veins   [] Non-healing ulcers Pulmonary:   [] Uses home oxygen   [] Productive cough   [] Hemoptysis   [] Wheeze  [] COPD   [] Asthma Neurologic:  [] Dizziness   [] Seizures   [] History of stroke   [] History of TIA  [] Aphasia   [] Vissual changes   [] Weakness or numbness in arm   [] Weakness or numbness in leg Musculoskeletal:   [x] Joint swelling   [x] Joint pain   [] Low back pain Hematologic:  [] Easy bruising  [] Easy bleeding   [] Hypercoagulable state   [] Anemic Gastrointestinal:  [] Diarrhea   [] Vomiting  [] Gastroesophageal reflux/heartburn   [] Difficulty swallowing. Genitourinary:  [] Chronic kidney disease   [] Difficult urination  [] Frequent urination   [] Blood in urine Skin:  [] Rashes   [] Ulcers  Psychological:  [] History of anxiety   []  History of major depression.  Physical Examination  Vitals:   07/12/17 1439  BP: (!) 146/74  Pulse: 88  Resp: 14  Weight: 166 lb (75.3 kg)  Height: 5\' 7"  (1.702 m)   Body mass index is 26 kg/m. Gen: WD/WN, NAD Head: [place]/AT, No temporalis wasting.  Ear/Nose/Throat: Hearing grossly intact, nares w/o erythema or drainage Eyes: PER, EOMI, sclera nonicteric.  Neck: Supple, no large masses.   Pulmonary:  Good air movement, no audible wheezing bilaterally, no  use of accessory muscles.  Cardiac: RRR, no JVD Vascular:  scattered varicosities present bilaterally.  Moderate venous stasis changes to the legs bilaterally.  2+ soft pitting edema Vessel Right Left  Radial Palpable Palpable  PT Trace Palpable Trace Palpable  DP Trace Palpable Trace Palpable  Gastrointestinal: Non-distended. No guarding/no peritoneal signs.  Musculoskeletal: M/S 5/5 throughout.  No deformity or atrophy.  Neurologic: CN 2-12 intact. Symmetrical.  Speech is fluent. Motor exam as listed above. Psychiatric: Judgment intact, Mood & affect appropriate for pt's clinical situation. Dermatologic: Venous rashes no ulcers noted.  No changes consistent with cellulitis. Lymph : No lichenification or skin changes of chronic lymphedema.  CBC Lab Results  Component Value Date   WBC 11.5 (H) 02/28/2016   HGB 11.7 (L) 02/28/2016  HCT 34.5 (L) 02/28/2016   MCV 93.2 02/28/2016   PLT 286 02/28/2016    BMET    Component Value Date/Time   NA 138 02/28/2016 0437   NA 135 01/31/2015 1024   K 3.5 02/28/2016 0437   K 4.6 01/31/2015 1024   CL 101 02/28/2016 0437   CL 101 01/31/2015 1024   CO2 28 02/28/2016 0437   CO2 31 01/31/2015 1024   GLUCOSE 121 (H) 02/28/2016 0437   GLUCOSE 114 (H) 01/31/2015 1024   BUN 11 02/28/2016 0437   BUN 18 01/31/2015 1024   CREATININE 0.84 02/28/2016 0437   CREATININE 0.93 01/31/2015 1024   CALCIUM 8.9 02/28/2016 0437   CALCIUM 10.2 01/31/2015 1024   GFRNONAA >60 02/28/2016 0437   GFRNONAA >60 01/31/2015 1024   GFRAA >60 02/28/2016 0437   GFRAA >60 01/31/2015 1024   CrCl cannot be calculated (Patient's most recent lab result is older than the maximum 21 days allowed.).  COAG Lab Results  Component Value Date   INR 0.89 02/12/2016    Radiology No results found.  Assessment/Plan 1. Chronic venous insufficiency No surgery or intervention at this point in time.  I have discussed the option of laser ablation but at this time her  compression has helped her tremendously and her primary c/o is really more directly about the left Knee.  She has already been told she needs a replacement and is seriously considering this.  I have had a long discussion with the patient regarding venous insufficiency and why it  causes symptoms. I have discussed with the patient the chronic skin changes that accompany venous insufficiency and the long term sequela such as infection and ulceration.  Patient will begin wearing graduated compression stockings class 1 (20-30 mmHg) or compression wraps on a daily basis a prescription was given. The patient will put the stockings on first thing in the morning and removing them in the evening. The patient is instructed specifically not to sleep in the stockings.    In addition, behavioral modification including several periods of elevation of the lower extremities during the day will be continued. I have demonstrated that proper elevation is a position with the ankles at heart level.  The patient is instructed to begin routine exercise, especially walking on a daily basis  Following the review of the ultrasound the patient will follow up in 12 months to reassess the degree of swelling and the control that graduated compression stockings or compression wraps  is offering.   The patient can be assessed for a Lymph Pump at that time.  Presently she is not interested in a pump at this time  2. Pain in both lower extremities See #1  3. PAD (peripheral artery disease) ([facility])  Recommend:  The patient has evidence of atherosclerosis of the lower extremities with claudication.  The patient does not voice lifestyle limiting changes at this point in time.  Noninvasive studies do not suggest clinically significant change.  No invasive studies, angiography or surgery at this time The patient should continue walking and begin a more formal exercise program.  The patient should continue antiplatelet therapy and  aggressive treatment of the lipid abnormalities  No changes in the patient's medications at this time  The patient should continue wearing graduated compression socks 10-15 mmHg strength to control the mild edema.    4. Raynaud's disease without gangrene The patient's Raynaud's is better and her symptoms are stable.  Behavioral modification was stressed; avoidance of cold and utilizing  wool socks was reviewed again.  The reported BP today was above the AHA goal and therefore there will continue Norvasc.  Norvasc 5 mg po daily is prescribed  The patient will follow up in PRN, sooner if there are problems.    5. Hypercholesterolemia Continue statin as ordered and reviewed, no changes at this time     Hortencia Pilar, MD  07/17/2017 12:49 PM

## 2017-07-19 DIAGNOSIS — E039 Hypothyroidism, unspecified: Secondary | ICD-10-CM | POA: Insufficient documentation

## 2017-08-20 ENCOUNTER — Other Ambulatory Visit: Payer: Self-pay | Admitting: Family Medicine

## 2017-08-20 DIAGNOSIS — R7989 Other specified abnormal findings of blood chemistry: Secondary | ICD-10-CM

## 2017-08-20 DIAGNOSIS — E010 Iodine-deficiency related diffuse (endemic) goiter: Secondary | ICD-10-CM

## 2017-09-13 ENCOUNTER — Ambulatory Visit
Admission: RE | Admit: 2017-09-13 | Discharge: 2017-09-13 | Disposition: A | Discharge status: Home / Self Care | Payer: Medicare Other | Source: Ambulatory Visit | Attending: Family Medicine | Admitting: Family Medicine

## 2017-09-13 DIAGNOSIS — R7989 Other specified abnormal findings of blood chemistry: Secondary | ICD-10-CM | POA: Diagnosis not present

## 2017-09-13 DIAGNOSIS — E042 Nontoxic multinodular goiter: Secondary | ICD-10-CM | POA: Insufficient documentation

## 2017-09-13 DIAGNOSIS — E010 Iodine-deficiency related diffuse (endemic) goiter: Secondary | ICD-10-CM | POA: Diagnosis present

## 2017-09-14 DIAGNOSIS — E010 Iodine-deficiency related diffuse (endemic) goiter: Secondary | ICD-10-CM | POA: Insufficient documentation

## 2018-01-04 ENCOUNTER — Other Ambulatory Visit: Payer: Self-pay | Admitting: Family Medicine

## 2018-01-04 DIAGNOSIS — Z1231 Encounter for screening mammogram for malignant neoplasm of breast: Secondary | ICD-10-CM

## 2018-01-18 DIAGNOSIS — E042 Nontoxic multinodular goiter: Secondary | ICD-10-CM | POA: Insufficient documentation

## 2018-02-02 ENCOUNTER — Ambulatory Visit
Admission: RE | Admit: 2018-02-02 | Discharge: 2018-02-02 | Disposition: A | Discharge status: Home / Self Care | Payer: Medicare Other | Source: Ambulatory Visit | Attending: Family Medicine | Admitting: Family Medicine

## 2018-02-02 DIAGNOSIS — Z9011 Acquired absence of right breast and nipple: Secondary | ICD-10-CM | POA: Insufficient documentation

## 2018-02-02 DIAGNOSIS — Z1231 Encounter for screening mammogram for malignant neoplasm of breast: Secondary | ICD-10-CM | POA: Diagnosis present

## 2018-07-14 ENCOUNTER — Ambulatory Visit (INDEPENDENT_AMBULATORY_CARE_PROVIDER_SITE_OTHER): Payer: Medicare Other | Admitting: Vascular Surgery

## 2018-08-22 ENCOUNTER — Encounter (INDEPENDENT_AMBULATORY_CARE_PROVIDER_SITE_OTHER): Payer: Self-pay | Admitting: Vascular Surgery

## 2018-08-22 ENCOUNTER — Ambulatory Visit (INDEPENDENT_AMBULATORY_CARE_PROVIDER_SITE_OTHER): Payer: Medicare Other | Admitting: Vascular Surgery

## 2018-08-22 VITALS — BP 134/74 | HR 87 | Resp 16 | Ht 67.0 in | Wt 163.0 lb

## 2018-08-22 DIAGNOSIS — I7300 Raynaud's syndrome without gangrene: Secondary | ICD-10-CM

## 2018-08-22 DIAGNOSIS — I872 Venous insufficiency (chronic) (peripheral): Secondary | ICD-10-CM | POA: Diagnosis not present

## 2018-08-22 DIAGNOSIS — E7800 Pure hypercholesterolemia, unspecified: Secondary | ICD-10-CM | POA: Diagnosis not present

## 2018-08-22 NOTE — Progress Notes (Signed)
MRN : [id number]  VENISHA BOEHNING is a 77 y.o. (12/14/1940) female who presents with chief complaint of  Chief Complaint  Patient presents with  . Follow-up    23yr follow up  .  History of Present Illness:   The patient returns to the office for followup evaluation regarding leg swelling.  The swelling has improved quite a bit and the pain associated with swelling has decreased substantially. There have not been any interval development of a ulcerations or wounds.  Since the previous visit the patient has been wearing graduated compression stockings and has noted little significant improvement in the lymphedema. The patient has been using compression routinely morning until night.  The patient also states elevation during the day and exercise is being done too.     Current Meds  Medication Sig  . Acetaminophen (TYLENOL ARTHRITIS PAIN PO) Take 2 capsules by mouth at bedtime.  Marland Kitchen atorvastatin (LIPITOR) 40 MG tablet Take 40 mg by mouth daily at 6 PM.   . Bisacodyl (CORRECTOL PO) Take 1 capsule by mouth at bedtime.  . calcium carbonate (OS-CAL) 600 MG TABS tablet Take 600 mg by mouth 2 (two) times daily with a meal.  . Cholecalciferol (D3 HIGH POTENCY) 1000 UNITS capsule Take 1,000 Units by mouth daily.  . Cyanocobalamin (B-12) 2500 MCG TABS Take 1 capsule by mouth every morning.  . enoxaparin (LOVENOX) 40 MG/0.4ML injection Inject 0.4 mLs (40 mg total) into the skin daily.  Marland Kitchen escitalopram (LEXAPRO) 10 MG tablet Take 10 mg by mouth at bedtime.  . fenofibrate micronized (LOFIBRA) 67 MG capsule Take 67 mg by mouth daily before breakfast.  . Ginseng 100 MG CAPS Take 1 capsule by mouth every morning.  . Glucosamine Sulfate 1000 MG CAPS Take 2 capsules by mouth every morning.  Marland Kitchen levothyroxine (SYNTHROID, LEVOTHROID) 75 MCG tablet Take by mouth.  . loratadine (CLARITIN) 10 MG tablet Take 10 mg by mouth daily.  . Multiple Vitamins-Minerals (WOMENS DAILY FORMULA PO) Take 1 capsule by mouth  every morning.  Marland Kitchen NIFEdipine (PROCARDIA-XL/ADALAT CC) 30 MG 24 hr tablet Take 30 mg by mouth daily.  . NONFORMULARY OR COMPOUNDED [place]  Combination Pain Cream -  Baclofen 2%, Doxepin 5%, Gabapentin 6%, Topiramate 2%, Pentoxifylline 3% Apply 1-2 grams to affected area 3-4 times daily Qty. 120 gm  3 refills  . Probiotic Product (PROBIOTIC DAILY PO) Take 1 capsule by mouth at bedtime.  . traMADol (ULTRAM) 50 MG tablet Take 1-2 tablets (50-100 mg total) by mouth every 4 (four) hours as needed for moderate pain.  . vitamin E 400 UNIT capsule Take 400 Units by mouth daily.    Past Medical History:  Diagnosis Date  . Arthritis   . Breast cancer ([place]) 06/25/86   rt mastecomy/chemo  and rad  . Complication of anesthesia    occ nausea  . Deviated septum   . Elevated cholesterol   . Neuropathy   . Personal history of chemotherapy   . Personal history of radiation therapy   . Pulmonary hypertension ([place])   . Raynaud disease   . Scleroderma ([place])   . Spastic colon     Past Surgical History:  Procedure Laterality Date  . APPENDECTOMY    . CARDIAC CATHETERIZATION N/A 06/07/2015   Procedure: Right Heart Cath;  Surgeon: Teodoro Spray, MD;  Location: [facility];  Service: Cardiovascular;  Laterality: N/A;  . CATARACT EXTRACTION Bilateral   . KNEE ARTHROPLASTY Right 02/26/2016  Procedure: COMPUTER ASSISTED TOTAL KNEE ARTHROPLASTY;  Surgeon: Dereck Leep, MD;  Location: [facility];  Service: Orthopedics;  Laterality: Right;  . MASTECTOMY Right 06/25/86   had chemo/rad  . NASAL SEPTUM SURGERY      Social History Social History   Tobacco Use  . Smoking status: Never Smoker  . Smokeless tobacco: Never Used  Substance Use Topics  . Alcohol use: No  . Drug use: No    Family History Family History  Problem Relation Age of Onset  . Breast cancer Maternal Aunt        mat great aunt  . Breast cancer Paternal Aunt     Allergies  Allergen Reactions  .  Gabapentin Swelling    Redness  . Aspirin Other (See Comments)    GI Bleed  . Boniva [Ibandronic Acid] Nausea And Vomiting  . Codeine Nausea And Vomiting  . Demerol [Meperidine] Nausea And Vomiting  . Fosamax [Alendronate] Nausea And Vomiting  . Norvasc [Amlodipine] Other (See Comments)    unknown  . Propoxyphene Nausea And Vomiting     REVIEW OF SYSTEMS (Negative unless checked)  Constitutional: [] Weight loss  [] Fever  [] Chills Cardiac: [] Chest pain   [] Chest pressure   [] Palpitations   [] Shortness of breath when laying flat   [] Shortness of breath with exertion. Vascular:  [] Pain in legs with walking   [] Pain in legs at rest  [] History of DVT   [] Phlebitis   [x] Swelling in legs   [x] Varicose veins   [] Non-healing ulcers Pulmonary:   [] Uses home oxygen   [] Productive cough   [] Hemoptysis   [] Wheeze  [] COPD   [] Asthma Neurologic:  [] Dizziness   [] Seizures   [] History of stroke   [] History of TIA  [] Aphasia   [] Vissual changes   [] Weakness or numbness in arm   [] Weakness or numbness in leg Musculoskeletal:   [] Joint swelling   [] Joint pain   [] Low back pain Hematologic:  [] Easy bruising  [] Easy bleeding   [] Hypercoagulable state   [] Anemic Gastrointestinal:  [] Diarrhea   [] Vomiting  [] Gastroesophageal reflux/heartburn   [] Difficulty swallowing. Genitourinary:  [] Chronic kidney disease   [] Difficult urination  [] Frequent urination   [] Blood in urine Skin:  [] Rashes   [] Ulcers  Psychological:  [] History of anxiety   []  History of major depression.  Physical Examination  Vitals:   08/22/18 1120  BP: 134/74  Pulse: 87  Resp: 16  Weight: 163 lb (73.9 kg)  Height: 5\' 7"  (1.702 m)   Body mass index is 25.53 kg/m. Gen: WD/WN, NAD Head: [place]/AT, No temporalis wasting.  Ear/Nose/Throat: Hearing grossly intact, nares w/o erythema or drainage Eyes: PER, EOMI, sclera nonicteric.  Neck: Supple, no large masses.   Pulmonary:  Good air movement, no audible wheezing bilaterally, no use of  accessory muscles.  Cardiac: RRR, no JVD Vascular: scattered varicosities present bilaterally.  Mild venous stasis changes to the legs bilaterally.  2+ soft pitting edema Vessel Right Left  Radial Palpable Palpable  PT Trace Palpable Trace Palpable  DP Trace Palpable Trace Palpable  Gastrointestinal: Non-distended. No guarding/no peritoneal signs.  Musculoskeletal: M/S 5/5 throughout.  No deformity or atrophy.  Neurologic: CN 2-12 intact. Symmetrical.  Speech is fluent. Motor exam as listed above. Psychiatric: Judgment intact, Mood & affect appropriate for pt's clinical situation. Dermatologic: No rashes or ulcers noted.  No changes consistent with cellulitis. Lymph : No lichenification or skin changes of chronic lymphedema.  CBC Lab Results  Component Value Date   WBC 11.5 (H) 02/28/2016  HGB 11.7 (L) 02/28/2016   HCT 34.5 (L) 02/28/2016   MCV 93.2 02/28/2016   PLT 286 02/28/2016    BMET    Component Value Date/Time   NA 138 02/28/2016 0437   NA 135 01/31/2015 1024   K 3.5 02/28/2016 0437   K 4.6 01/31/2015 1024   CL 101 02/28/2016 0437   CL 101 01/31/2015 1024   CO2 28 02/28/2016 0437   CO2 31 01/31/2015 1024   GLUCOSE 121 (H) 02/28/2016 0437   GLUCOSE 114 (H) 01/31/2015 1024   BUN 11 02/28/2016 0437   BUN 18 01/31/2015 1024   CREATININE 0.84 02/28/2016 0437   CREATININE 0.93 01/31/2015 1024   CALCIUM 8.9 02/28/2016 0437   CALCIUM 10.2 01/31/2015 1024   GFRNONAA >60 02/28/2016 0437   GFRNONAA >60 01/31/2015 1024   GFRAA >60 02/28/2016 0437   GFRAA >60 01/31/2015 1024   CrCl cannot be calculated (Patient's most recent lab result is older than the maximum 21 days allowed.).  COAG Lab Results  Component Value Date   INR 0.89 02/12/2016    Radiology No results found.   Assessment/Plan 1. Chronic venous insufficiency No surgery or intervention at this point in time.  I have reviewed my discussion with the patient regarding venous insufficiency and why it  causes symptoms. I have discussed with the patient the chronic skin changes that accompany venous insufficiency and the long term sequela such as ulceration. Patient will contnue wearing graduated compression stockings on a daily basis, as this has provided excellent control of his edema. The patient will put the stockings on first thing in the morning and removing them in the evening. The patient is reminded not to sleep in the stockings.  In addition, behavioral modification including elevation during the day will be initiated. Exercise is strongly encouraged.  Given the patient's good control and lack of any problems regarding the venous insufficiency and lymphedema a lymph pump in not need at this time.  The patient will follow up with me PRN should anything change.  The patient voices agreement with this plan.   2. Hypercholesterolemia Continue statin as ordered and reviewed, no changes at this time   3. Raynaud's disease without gangrene Recommend:  The patient is currently tolerating the Raynaud's changes fairly well. Lengthy discussion regarding keeping the feet and the hands warm; gloves, washing with only warm water (especially avoiding cold water immersion) and using wool socks, specifically Smartwool was recommended.  The patient is currently taking Procardia which was discussed.  The use of Pletal was also reviewed as well as the fact that it is an off label use which is typically reserved for failures of Norvasc and conservative therapy.  Also it is found to be useful in patients that have ulcerated.  The patient will follow up PRN if the changes worsen or persist.          Hortencia Pilar, MD  08/22/2018 11:29 AM

## 2018-08-28 ENCOUNTER — Encounter (INDEPENDENT_AMBULATORY_CARE_PROVIDER_SITE_OTHER): Payer: Self-pay | Admitting: Vascular Surgery

## 2019-02-07 ENCOUNTER — Other Ambulatory Visit: Payer: Self-pay | Admitting: Family Medicine

## 2019-02-07 DIAGNOSIS — Z1231 Encounter for screening mammogram for malignant neoplasm of breast: Secondary | ICD-10-CM

## 2019-04-05 ENCOUNTER — Other Ambulatory Visit: Payer: Self-pay

## 2019-04-05 ENCOUNTER — Ambulatory Visit
Admission: RE | Admit: 2019-04-05 | Discharge: 2019-04-05 | Disposition: A | Discharge status: Home / Self Care | Payer: Medicare Other | Source: Ambulatory Visit | Attending: Family Medicine | Admitting: Family Medicine

## 2019-04-05 ENCOUNTER — Encounter (INDEPENDENT_AMBULATORY_CARE_PROVIDER_SITE_OTHER): Payer: Self-pay

## 2019-04-05 DIAGNOSIS — Z1231 Encounter for screening mammogram for malignant neoplasm of breast: Secondary | ICD-10-CM

## 2019-05-17 DIAGNOSIS — R42 Dizziness and giddiness: Secondary | ICD-10-CM | POA: Insufficient documentation

## 2019-05-17 DIAGNOSIS — F411 Generalized anxiety disorder: Secondary | ICD-10-CM | POA: Insufficient documentation

## 2020-01-11 ENCOUNTER — Ambulatory Visit: Payer: Medicare Other | Attending: Internal Medicine

## 2020-01-11 ENCOUNTER — Other Ambulatory Visit: Payer: Self-pay

## 2020-01-11 DIAGNOSIS — Z23 Encounter for immunization: Secondary | ICD-10-CM

## 2020-01-11 NOTE — Progress Notes (Signed)
   Covid-19 Vaccination Clinic  Name:  Veronica Hoffman    MRN: [id number] DOB: [birth year]  01/11/2020  [name] was observed post Covid-19 immunization for 15 minutes without incident. She was provided with Vaccine Information Sheet and instruction to access the V-Safe system.   [name] was instructed to call 911 with any severe reactions post vaccine: Marland Kitchen Difficulty breathing  . Swelling of face and throat  . A fast heartbeat  . A bad rash all over body  . Dizziness and weakness   Immunizations Administered    Name Date Dose VIS Date Route   Pfizer COVID-19 Vaccine 01/11/2020 10:35 AM 0.3 mL 10/13/2019 Intramuscular   Manufacturer: [place]   Lot: [id number]   [place]: [id number]

## 2020-02-06 ENCOUNTER — Ambulatory Visit: Payer: Medicare Other | Attending: Internal Medicine

## 2020-02-06 DIAGNOSIS — Z23 Encounter for immunization: Secondary | ICD-10-CM

## 2020-02-06 NOTE — Progress Notes (Signed)
   Covid-19 Vaccination Clinic  Name:  SELAH MAURI    MRN: [id number] DOB: Mar 20, 1941  02/06/2020  Ms. Carrigan was observed post Covid-19 immunization for 15 minutes without incident. She was provided with Vaccine Information Sheet and instruction to access the V-Safe system.   Ms. Moan was instructed to call 911 with any severe reactions post vaccine: Marland Kitchen Difficulty breathing  . Swelling of face and throat  . A fast heartbeat  . A bad rash all over body  . Dizziness and weakness   Immunizations Administered    Name Date Dose VIS Date Route   Pfizer COVID-19 Vaccine 02/06/2020 10:48 AM 0.3 mL 10/13/2019 Intramuscular   Manufacturer: [place]   Lot: [id number]   [place]: [id number]

## 2020-03-27 ENCOUNTER — Other Ambulatory Visit: Payer: Self-pay | Admitting: Family Medicine

## 2020-03-27 DIAGNOSIS — Z1231 Encounter for screening mammogram for malignant neoplasm of breast: Secondary | ICD-10-CM

## 2020-04-18 ENCOUNTER — Other Ambulatory Visit: Payer: Self-pay

## 2020-04-18 ENCOUNTER — Ambulatory Visit
Admission: RE | Admit: 2020-04-18 | Discharge: 2020-04-18 | Disposition: A | Discharge status: Home / Self Care | Payer: Medicare Other | Source: Ambulatory Visit | Attending: Family Medicine | Admitting: Family Medicine

## 2020-04-18 DIAGNOSIS — Z1231 Encounter for screening mammogram for malignant neoplasm of breast: Secondary | ICD-10-CM | POA: Diagnosis present

## 2020-10-14 DIAGNOSIS — G8929 Other chronic pain: Secondary | ICD-10-CM | POA: Insufficient documentation

## 2021-04-30 ENCOUNTER — Other Ambulatory Visit: Payer: Self-pay | Admitting: Family Medicine

## 2021-04-30 DIAGNOSIS — Z1231 Encounter for screening mammogram for malignant neoplasm of breast: Secondary | ICD-10-CM

## 2021-05-14 ENCOUNTER — Other Ambulatory Visit: Payer: Self-pay

## 2021-05-14 ENCOUNTER — Ambulatory Visit
Admission: RE | Admit: 2021-05-14 | Discharge: 2021-05-14 | Disposition: A | Discharge status: Home / Self Care | Payer: Medicare Other | Source: Ambulatory Visit | Attending: Family Medicine | Admitting: Family Medicine

## 2021-05-14 DIAGNOSIS — Z1231 Encounter for screening mammogram for malignant neoplasm of breast: Secondary | ICD-10-CM | POA: Diagnosis present

## 2022-05-12 ENCOUNTER — Other Ambulatory Visit: Payer: Self-pay | Admitting: Family Medicine

## 2022-05-12 DIAGNOSIS — Z1231 Encounter for screening mammogram for malignant neoplasm of breast: Secondary | ICD-10-CM

## 2022-05-28 ENCOUNTER — Ambulatory Visit
Admission: RE | Admit: 2022-05-28 | Discharge: 2022-05-28 | Disposition: A | Discharge status: Home / Self Care | Payer: Medicare Other | Source: Ambulatory Visit | Attending: Family Medicine | Admitting: Family Medicine

## 2022-05-28 DIAGNOSIS — Z1231 Encounter for screening mammogram for malignant neoplasm of breast: Secondary | ICD-10-CM | POA: Insufficient documentation

## 2022-11-02 NOTE — Discharge Instructions (Signed)
Instructions after Total Knee Replacement   Mayar Whittier P. Uriel Dowding, Jr., M.D.     Dept. of Orthopaedics & Sports Medicine  [facility]  [address]  [place], [place]  [address]  Phone: [phone]   Fax: [phone]    DIET: Drink plenty of non-alcoholic fluids. Resume your normal diet. Include foods high in fiber.  ACTIVITY:  You may use crutches or a walker with weight-bearing as tolerated, unless instructed otherwise. You may be weaned off of the walker or crutches by your Physical Therapist.  Do NOT place pillows under the knee. Anything placed under the knee could limit your ability to straighten the knee.   Continue doing gentle exercises. Exercising will reduce the pain and swelling, increase motion, and prevent muscle weakness.   Please continue to use the TED compression stockings for 6 weeks. You may remove the stockings at night, but should reapply them in the morning. Do not drive or operate any equipment until instructed.  WOUND CARE:  Continue to use the PolarCare or ice packs periodically to reduce pain and swelling. You may bathe or shower after the staples are removed at the first office visit following surgery.  MEDICATIONS: You may resume your regular medications. Please take the pain medication as prescribed on the medication. Do not take pain medication on an empty stomach. You have been given a prescription for a blood thinner (Lovenox or Coumadin). Please take the medication as instructed. (NOTE: After completing a 2 week course of Lovenox, take one Enteric-coated aspirin once a day. This along with elevation will help reduce the possibility of phlebitis in your operated leg.) Do not drive or drink alcoholic beverages when taking pain medications.  CALL THE OFFICE FOR: Temperature above 101 degrees Excessive bleeding or drainage on the dressing. Excessive swelling, coldness, or paleness of the toes. Persistent nausea and vomiting.  FOLLOW-UP:  You  should have an appointment to return to the office in 10-14 days after surgery. Arrangements have been made for continuation of Physical Therapy (either home therapy or outpatient therapy).   [facility] Department Directory         [email or web address]       [email or web address]          Cardiology  Appointments: [place] - [phone] [place] - [phone]  Endocrinology  Appointments: [place] - [phone] [place] - [phone]  Gastroenterology  Appointments: [place] - [phone] [place] - [phone]        General Surgery   Appointments: [place] - [phone]  Internal Medicine/Family Medicine  Appointments: [place] - [phone] [place] - [phone] [place] - [phone]  Metabolic and Weigh Loss Surgery  Appointments: [place] - [phone]        Neurology  Appointments: [place] - [phone] [place] - [phone]  Neurosurgery  Appointments: [place] - [phone]  Obstetrics & Gynecology  Appointments: [place] - [phone] [place] - [phone]        Pediatrics  Appointments: [place] - [phone] [place] - [phone]  Physiatry  Appointments: [place] -[phone]  Physical Therapy  Appointments: [place] - [phone] [place] - [phone]        Podiatry  Appointments: [place] - [phone] [place] - [phone]  Pulmonology  Appointments: [place] - [phone]  Rheumatology  Appointments: [place] - [phone]        [place] Location: [facility]  [address] , [place]  [address]  [place] Location: [facility] [address] [place], [place]  [address]  [place] Location: [facility] [address] [place], [place]  [address]    

## 2022-11-13 ENCOUNTER — Encounter
Admission: RE | Admit: 2022-11-13 | Discharge: 2022-11-13 | Disposition: A | Discharge status: Home / Self Care | Payer: Medicare Other | Source: Ambulatory Visit | Attending: Orthopedic Surgery | Admitting: Orthopedic Surgery

## 2022-11-13 ENCOUNTER — Other Ambulatory Visit: Payer: Self-pay

## 2022-11-13 VITALS — BP 148/88 | HR 98 | Resp 18 | Ht 67.0 in | Wt 175.9 lb

## 2022-11-13 DIAGNOSIS — M349 Systemic sclerosis, unspecified: Secondary | ICD-10-CM | POA: Insufficient documentation

## 2022-11-13 DIAGNOSIS — I4510 Unspecified right bundle-branch block: Secondary | ICD-10-CM | POA: Diagnosis not present

## 2022-11-13 DIAGNOSIS — G609 Hereditary and idiopathic neuropathy, unspecified: Secondary | ICD-10-CM | POA: Insufficient documentation

## 2022-11-13 DIAGNOSIS — I272 Pulmonary hypertension, unspecified: Secondary | ICD-10-CM | POA: Insufficient documentation

## 2022-11-13 DIAGNOSIS — E7800 Pure hypercholesterolemia, unspecified: Secondary | ICD-10-CM | POA: Insufficient documentation

## 2022-11-13 DIAGNOSIS — Z01818 Encounter for other preprocedural examination: Secondary | ICD-10-CM | POA: Insufficient documentation

## 2022-11-13 DIAGNOSIS — I7300 Raynaud's syndrome without gangrene: Secondary | ICD-10-CM | POA: Diagnosis not present

## 2022-11-13 DIAGNOSIS — I739 Peripheral vascular disease, unspecified: Secondary | ICD-10-CM | POA: Insufficient documentation

## 2022-11-13 DIAGNOSIS — M1712 Unilateral primary osteoarthritis, left knee: Secondary | ICD-10-CM | POA: Diagnosis not present

## 2022-11-13 DIAGNOSIS — Z01812 Encounter for preprocedural laboratory examination: Secondary | ICD-10-CM

## 2022-11-13 HISTORY — DX: Depression, unspecified: F32.A

## 2022-11-13 HISTORY — DX: Anxiety disorder, unspecified: F41.9

## 2022-11-13 HISTORY — DX: Hypothyroidism, unspecified: E03.9

## 2022-11-13 HISTORY — DX: Myoneural disorder, unspecified: G70.9

## 2022-11-13 LAB — URINALYSIS, ROUTINE W REFLEX MICROSCOPIC
Bacteria, UA: NONE SEEN
Bilirubin Urine: NEGATIVE
Glucose, UA: NEGATIVE mg/dL
Hgb urine dipstick: NEGATIVE
Ketones, ur: NEGATIVE mg/dL
Leukocytes,Ua: NEGATIVE
Nitrite: NEGATIVE
Protein, ur: NEGATIVE mg/dL
Specific Gravity, Urine: 1.011 (ref 1.005–1.030)
Squamous Epithelial / HPF: NONE SEEN /HPF (ref 0–5)
pH: 7 (ref 5.0–8.0)

## 2022-11-13 LAB — CBC
HCT: 38.6 % (ref 36.0–46.0)
Hemoglobin: 12.5 g/dL (ref 12.0–15.0)
MCH: 31 pg (ref 26.0–34.0)
MCHC: 32.4 g/dL (ref 30.0–36.0)
MCV: 95.8 fL (ref 80.0–100.0)
Platelets: 326 10*3/uL (ref 150–400)
RBC: 4.03 MIL/uL (ref 3.87–5.11)
RDW: 12.7 % (ref 11.5–15.5)
WBC: 7.5 10*3/uL (ref 4.0–10.5)
nRBC: 0 % (ref 0.0–0.2)

## 2022-11-13 LAB — TYPE AND SCREEN
ABO/RH(D): O POS
Antibody Screen: NEGATIVE

## 2022-11-13 LAB — COMPREHENSIVE METABOLIC PANEL
ALT: 28 U/L (ref 0–44)
AST: 32 U/L (ref 15–41)
Albumin: 4 g/dL (ref 3.5–5.0)
Alkaline Phosphatase: 56 U/L (ref 38–126)
Anion gap: 9 (ref 5–15)
BUN: 18 mg/dL (ref 8–23)
CO2: 23 mmol/L (ref 22–32)
Calcium: 9.5 mg/dL (ref 8.9–10.3)
Chloride: 103 mmol/L (ref 98–111)
Creatinine, Ser: 0.87 mg/dL (ref 0.44–1.00)
GFR, Estimated: >60 mL/min (ref >60)
Glucose, Bld: 97 mg/dL (ref 70–99)
Potassium: 4.1 mmol/L (ref 3.5–5.1)
Sodium: 135 mmol/L (ref 135–145)
Total Bilirubin: 0.5 mg/dL (ref 0.3–1.2)
Total Protein: 7.5 g/dL (ref 6.5–8.1)

## 2022-11-13 LAB — SEDIMENTATION RATE: Sed Rate: 41 mm/hr — ABNORMAL HIGH (ref 0–30)

## 2022-11-13 LAB — SURGICAL PCR SCREEN
MRSA, PCR: NEGATIVE
Staphylococcus aureus: POSITIVE — AB

## 2022-11-13 LAB — C-REACTIVE PROTEIN: CRP: 0.6 mg/dL (ref <1.0)

## 2022-11-13 NOTE — Patient Instructions (Addendum)
Your procedure is scheduled on: 11/25/22 - Wednesday Report to the Registration Desk on the 1st floor of the [facility]. To find out your arrival time, please call [phone] between 1PM - 3PM on: 11/24/22 - Tuesday If your arrival time is 6:00 am, do not arrive prior to that time as the [facility] entrance doors do not open until 6:00 am.  REMEMBER: Instructions that are not followed completely may result in serious medical risk, up to and including death; or upon the discretion of your surgeon and anesthesiologist your surgery may need to be rescheduled.  Do not eat food after midnight the night before surgery.  No gum chewing, lozengers or hard candies.  You may however, drink CLEAR liquids up to 2 hours before you are scheduled to arrive for your surgery. Do not drink anything within 2 hours of your scheduled arrival time.  Clear liquids include: - water  - apple juice without pulp - gatorade (not RED colors) - black coffee or tea (Do NOT add milk or creamers to the coffee or tea) Do NOT drink anything that is not on this list.  In addition, your doctor has ordered for you to drink the provided  Ensure Pre-Surgery Clear Carbohydrate Drink  Drinking this carbohydrate drink up to two hours before surgery helps to reduce insulin resistance and improve patient outcomes. Please complete drinking 2 hours prior to scheduled arrival time.  TAKE THESE MEDICATIONS THE MORNING OF SURGERY WITH A SIP OF WATER:  - levothyroxine (SYNTHROID, LEVOTHROID  - NIFEdipine (PROCARDIA-XL/ADALAT CC)  - omeprazole (PRILOSEC)   One week prior to surgery: Stop Anti-inflammatories (NSAIDS) such as Advil, Aleve, Ibuprofen, Motrin, Naproxen, Naprosyn and Aspirin based products such as Excedrin, Goodys Powder, BC Powder.  Stop ANY OVER THE COUNTER supplements until after surgery beginning 11/18/22.  You may however, continue to take Tylenol if needed for pain up until the day of surgery.  No Alcohol  for 24 hours before or after surgery.  No Smoking including e-cigarettes for 24 hours prior to surgery.  No chewable tobacco products for at least 6 hours prior to surgery.  No nicotine patches on the day of surgery.  Do not use any "recreational" drugs for at least a week prior to your surgery.  Please be advised that the combination of cocaine and anesthesia may have negative outcomes, up to and including death. If you test positive for cocaine, your surgery will be cancelled.  On the morning of surgery brush your teeth with toothpaste and water, you may rinse your mouth with mouthwash if you wish. Do not swallow any toothpaste or mouthwash.  Use CHG Soap or wipes as directed on instruction sheet.  Do not wear jewelry, make-up, hairpins, clips or nail polish.  Do not wear lotions, powders, or perfumes.   Do not shave body from the neck down 48 hours prior to surgery just in case you cut yourself which could leave a site for infection.  Also, freshly shaved skin may become irritated if using the CHG soap.  Contact lenses, hearing aids and dentures may not be worn into surgery.  Do not bring valuables to the hospital. [facility] is not responsible for any missing/lost belongings or valuables.   Notify your doctor if there is any change in your medical condition (cold, fever, infection).  Wear comfortable clothing (specific to your surgery type) to the hospital.  After surgery, you can help prevent lung complications by doing breathing exercises.  Take deep breaths and  cough every 1-2 hours. Your doctor may order a device called an Incentive Spirometer to help you take deep breaths. When coughing or sneezing, hold a pillow firmly against your incision with both hands. This is called "splinting." Doing this helps protect your incision. It also decreases belly discomfort.  If you are being admitted to the hospital overnight, leave your suitcase in the car. After surgery it may be  brought to your room.  If you are being discharged the day of surgery, you will not be allowed to drive home. You will need a responsible adult (18 years or older) to drive you home and stay with you that night.   If you are taking public transportation, you will need to have a responsible adult (18 years or older) with you. Please confirm with your physician that it is acceptable to use public transportation.   Please call the [place] Dept. at [phone] if you have any questions about these instructions.  Surgery Visitation Policy:  Patients undergoing a surgery or procedure may have two family members or support persons with them as long as the person is not COVID-19 positive or experiencing its symptoms.   Inpatient Visitation:    Visiting hours are 7 a.m. to 8 p.m. Up to four visitors are allowed at one time in a patient room. The visitors may rotate out with other people during the day. One designated support person (adult) may remain overnight.  Due to an increase in RSV and influenza rates and associated hospitalizations, children ages 40 and under will not be able to visit patients in [facility]. Masks continue to be strongly recommended.

## 2022-11-22 NOTE — H&P (Signed)
ORTHOPAEDIC HISTORY & PHYSICAL Regino Bellow, PA - 11/16/2022 10:15 AM EST Formatting of this note is different from the original. Images from the original note were not included. Chief Complaint Chief Complaint Patient presents with Knee Pain H & P LEFT KNEE  Reason for Visit Veronica Hoffman is a 82 y.o. who presents today for a history physical. She is to undergo a left total knee arthroplasty on 11/25/2012. Since her last visit here in the clinic there has been a change in her condition. Patient is presents her wishes to continue with to proceed with having surgery to the knee.  Patient has been experiencing discomfort in both knees but recently underwent a right total knee arthroplasty in 2017 has been doing great with but the left knee has continued to give her problems.She localizes the most significant pain along the anterior aspect of the knee. The pain is aggravated by stair ambulation or arising from a seated position. She reports some mild swelling and near giving way. She did not appreciate any significant lasting improvement of the symptoms following viscosupplementation. However, the intraarticular corticosteroid injection in February has provided some benefit. 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 Positive family history. Mildly elevated RVSP. Raynaud's. Thyromegaly: REPEAT 1 year THYROID U/S 09/13/2018 09/14/2017  Past Surgical History Past Surgical  History: Procedure Laterality Date LENS EYE SURGERY Bilateral 2013 [facility] COLONOSCOPY 04/26/2012 12/31/2004 Right total knee arthroplasty using computer-assisted navigation 02/26/2016 Dr Marry Guan APPENDECTOMY CATARACT EXTRACTION DILATION AND CURETTAGE, DIAGNOSTIC / THERAPEUTIC MASTECTOMY Right 30 years ago Nasal surgery thyroid ultrasound  Past Family History Family History Problem Relation Age of Onset Stroke Mother High blood pressure (Hypertension) Mother Diabetes Father Myocardial Infarction (Heart attack) Father COPD Father Heart disease Sister Scleroderma Sister Diabetes Paternal Grandfather  Medications Current Outpatient Medications Ordered in Epic Medication Sig Dispense Refill acetaminophen (TYLENOL) 650 MG ER tablet Take 650 mg by mouth 2 (two) times daily atorvastatin (LIPITOR) 40 MG tablet Take 1 tablet (40 mg total) by mouth once daily 90 tablet 3 BIOTIN ORAL Take 1 tablet by mouth once daily cholecalciferol (VITAMIN D3) 1000 unit tablet Take 1 Units by mouth once daily cyanocobalamin (VITAMIN B12) 1000 MCG tablet Take 1,000 mcg by mouth once daily. dimenhyDRINATE (DRAMAMINE) 50 mg tablet Take 50 mg by mouth every 8 (eight) hours as needed fenofibrate micronized (LOFIBRA) 67 MG capsule Take 1 capsule (67 mg total) by mouth once daily 90 capsule 3 fluticasone propionate (FLONASE) 50 mcg/actuation nasal spray Place 2 sprays into both nostrils once daily GLUC SU/CHONDRO SU A/VIT C/MN (GLUCOSAMINE 1500 COMPLEX ORAL) Take 1 tablet by mouth once daily levothyroxine (SYNTHROID) 75 MCG tablet Take 1 tablet (75 mcg total) by mouth once daily Take on an empty stomach with a glass of water at least 30-60 minutes before breakfast. 90 tablet 3 loratadine (CLARITIN) 10 mg tablet Take 10 mg by mouth once daily. multivitamin tablet Take 1 tablet by mouth daily. NIFEdipine (ADALAT CC) 30 MG ER tablet Take 1 tablet (30 mg total) by mouth once daily 90 tablet 3 omeprazole  (PRILOSEC) 20  MG DR capsule Take 20 mg by mouth once daily trolamine salicylate (ASPERCREME) 10 % cream Apply topically as needed for muscle pain. venlafaxine (EFFEXOR-XR) 150 MG XR capsule Take 1 capsule (150 mg total) by mouth once daily 90 capsule 3 vitamin E 400 UNIT capsule Take 400 Units by mouth daily. white petrolatum-mineral oiL (LUBRIFRESH PM) 83-15 % ophthalmic ointment Place 1 drop into both eyes as needed  No current Epic-ordered facility-administered medications on file.  Allergies Allergies Allergen Reactions Adhesive Bandage Other (See Comments) Bandaid-rash Gabapentin Swelling Redness Adhesive Rash bandaids Amlodipine Other (See Comments) "Feel bad all over" Aspirin Other (See Comments) easy bleeding Boniva [Ibandronate] Other (See Comments) "Feel bad all over" Codeine Nausea Fosamax [Alendronate] Other (See Comments) "Feel bad all over" Meperidine Nausea Propoxyphene Nausea   Review of Systems A comprehensive 14 point ROS was performed, reviewed, and the pertinent orthopaedic findings are documented in the HPI.  Exam BP (!) 140/80 (BP Location: Left upper arm, Patient Position: Sitting, BP Cuff Size: Large Adult)  Ht 170.2 cm ('5\' 7"'$ )  Wt 79.6 kg (175 lb 6.4 oz)  BMI 27.47 kg/m  General: Well-developed well-nourished female seen in no acute distress.  HEENT: Atraumatic,normocephalic. Pupils are equal and reactive to light. Oropharynx is clear with moist mucosa  Lungs: Clear to auscultation bilaterally  Cardiovascular: Regular rate and rhythm. Normal S1, S2. No murmurs. No appreciable gallops or rubs. Peripheral pulses are palpable.  Abdomen: Soft, non-tender, nondistended. Bowel sounds present  Extremity: Left Knee: Soft tissue swelling: minimal Effusion: none Erythema: none Crepitance: moderate; patellar grind test is positive Tenderness: Anterior knee tenderness Alignment: normal Mediolateral laxity: stable Anterior drawer  test:negative Lachman`s test: negative McMurray`s test: negative Atrophy: No significant atrophy. Quadriceps tone was fair to good. Range of Motion: 0/3/118 degrees  Neurological:  The patient is alert and oriented Sensation to light touch appears to be intact and within normal limits Gross motor strength appeared to be equal to 5/5  Vascular :  Peripheral pulses felt to be palpable. Capillary refill appears to be intact and within normal limits  X-ray  AP standing, lateral and sunrise view of the left knee ordered and interpreted on today's visit shows narrowing of the medial and lateral cartilage spaces Osteophyte formation is noted. She is noted to have significant degenerative changes to the patellofemoral articulation are noted. No evidence of fracture or dislocation.  Impression  1. Degenerative arthrosis left knee  Plan  1. I did go over the patient's medication list on today's visit. 2. Past medical history was reviewed 3. Return to clinic postop as scheduled. Sooner if any problems 4. Did discuss postop rehab course  This note was generated in part with voice recognition software and I apologize for any typographical errors that were not detected and corrected   Watt Climes PA Electronically signed by Regino Bellow, PA at 11/16/2022 10:50 AM EST

## 2022-11-25 ENCOUNTER — Observation Stay: Payer: Medicare Other

## 2022-11-25 ENCOUNTER — Ambulatory Visit: Payer: Medicare Other

## 2022-11-25 ENCOUNTER — Observation Stay
Admission: RE | Admit: 2022-11-25 | Discharge: 2022-11-26 | Disposition: A | Discharge status: Home / Self Care | Payer: Medicare Other | Source: Ambulatory Visit | Attending: Orthopedic Surgery | Admitting: Orthopedic Surgery

## 2022-11-25 ENCOUNTER — Encounter: Payer: Self-pay | Admitting: Orthopedic Surgery

## 2022-11-25 ENCOUNTER — Encounter
Admission: RE | Disposition: A | Discharge status: Home / Self Care | Payer: Self-pay | Source: Ambulatory Visit | Attending: Orthopedic Surgery

## 2022-11-25 ENCOUNTER — Other Ambulatory Visit: Payer: Self-pay

## 2022-11-25 ENCOUNTER — Ambulatory Visit: Payer: Medicare Other | Admitting: Urgent Care

## 2022-11-25 DIAGNOSIS — M1712 Unilateral primary osteoarthritis, left knee: Secondary | ICD-10-CM | POA: Diagnosis present

## 2022-11-25 DIAGNOSIS — E7800 Pure hypercholesterolemia, unspecified: Secondary | ICD-10-CM

## 2022-11-25 DIAGNOSIS — Z96651 Presence of right artificial knee joint: Secondary | ICD-10-CM | POA: Insufficient documentation

## 2022-11-25 DIAGNOSIS — Z79899 Other long term (current) drug therapy: Secondary | ICD-10-CM | POA: Diagnosis not present

## 2022-11-25 DIAGNOSIS — I872 Venous insufficiency (chronic) (peripheral): Secondary | ICD-10-CM

## 2022-11-25 DIAGNOSIS — Z853 Personal history of malignant neoplasm of breast: Secondary | ICD-10-CM | POA: Diagnosis not present

## 2022-11-25 DIAGNOSIS — Z96659 Presence of unspecified artificial knee joint: Secondary | ICD-10-CM

## 2022-11-25 DIAGNOSIS — G609 Hereditary and idiopathic neuropathy, unspecified: Secondary | ICD-10-CM

## 2022-11-25 DIAGNOSIS — H269 Unspecified cataract: Secondary | ICD-10-CM | POA: Insufficient documentation

## 2022-11-25 DIAGNOSIS — I739 Peripheral vascular disease, unspecified: Secondary | ICD-10-CM

## 2022-11-25 DIAGNOSIS — I272 Pulmonary hypertension, unspecified: Secondary | ICD-10-CM

## 2022-11-25 DIAGNOSIS — M349 Systemic sclerosis, unspecified: Secondary | ICD-10-CM

## 2022-11-25 DIAGNOSIS — I7300 Raynaud's syndrome without gangrene: Secondary | ICD-10-CM

## 2022-11-25 HISTORY — PX: KNEE ARTHROPLASTY: SHX992

## 2022-11-25 SURGERY — ARTHROPLASTY, KNEE, TOTAL, USING IMAGELESS COMPUTER-ASSISTED NAVIGATION
Anesthesia: Spinal | Site: Knee | Laterality: Left

## 2022-11-25 MED ORDER — GLYCOPYRROLATE 0.2 MG/ML IJ SOLN
INTRAMUSCULAR | Status: DC | PRN
  Administered 2022-11-25: .1 mg via INTRAVENOUS

## 2022-11-25 MED ORDER — ACETAMINOPHEN 10 MG/ML IV SOLN
1000.0000 mg | Freq: Four times a day (QID) | INTRAVENOUS | Status: DC
  Administered 2022-11-26: 1000 mg via INTRAVENOUS

## 2022-11-25 MED ORDER — BUPIVACAINE HCL (PF) 0.25 % IJ SOLN
INTRAMUSCULAR | Status: AC
  Filled 2022-11-25: qty 60

## 2022-11-25 MED ORDER — DEXAMETHASONE SODIUM PHOSPHATE 10 MG/ML IJ SOLN: 8.0000 mg | Freq: Once | INTRAMUSCULAR | Status: AC

## 2022-11-25 MED ORDER — BUPIVACAINE LIPOSOME 1.3 % IJ SUSP
INTRAMUSCULAR | Status: AC
  Filled 2022-11-25: qty 20

## 2022-11-25 MED ORDER — PANTOPRAZOLE SODIUM 40 MG PO TBEC: 40.0000 mg | DELAYED_RELEASE_TABLET | Freq: Two times a day (BID) | ORAL | Status: DC

## 2022-11-25 MED ORDER — FERROUS SULFATE 325 (65 FE) MG PO TABS
325.0000 mg | ORAL_TABLET | Freq: Two times a day (BID) | ORAL | Status: DC
  Administered 2022-11-25: 325 mg via ORAL

## 2022-11-25 MED ORDER — FENTANYL CITRATE (PF) 100 MCG/2ML IJ SOLN
INTRAMUSCULAR | Status: DC | PRN
  Administered 2022-11-25: 50 ug via INTRAVENOUS
  Administered 2022-11-25 (×2): 25 ug via INTRAVENOUS

## 2022-11-25 MED ORDER — FENTANYL CITRATE (PF) 100 MCG/2ML IJ SOLN
INTRAMUSCULAR | Status: AC
  Filled 2022-11-25: qty 2

## 2022-11-25 MED ORDER — FLEET ENEMA 7-19 GM/118ML RE ENEM: 1.0000 | ENEMA | Freq: Once | RECTAL | Status: DC | PRN

## 2022-11-25 MED ORDER — LACTATED RINGERS IV SOLN: INTRAVENOUS | Status: DC

## 2022-11-25 MED ORDER — DEXAMETHASONE SODIUM PHOSPHATE 10 MG/ML IJ SOLN
INTRAMUSCULAR | Status: AC
  Administered 2022-11-25: 8 mg via INTRAVENOUS
  Filled 2022-11-25: qty 1

## 2022-11-25 MED ORDER — CELECOXIB 200 MG PO CAPS: 200.0000 mg | ORAL_CAPSULE | Freq: Two times a day (BID) | ORAL | Status: DC

## 2022-11-25 MED ORDER — ENSURE PRE-SURGERY PO LIQD
296.0000 mL | Freq: Once | ORAL | Status: AC
  Administered 2022-11-25: 296 mL via ORAL

## 2022-11-25 MED ORDER — OXYCODONE HCL 5 MG PO TABS: 5.0000 mg | ORAL_TABLET | ORAL | Status: DC | PRN

## 2022-11-25 MED ORDER — KETAMINE HCL 50 MG/5ML IJ SOSY
PREFILLED_SYRINGE | INTRAMUSCULAR | Status: AC
  Filled 2022-11-25: qty 5

## 2022-11-25 MED ORDER — CHLORHEXIDINE GLUCONATE 4 % EX LIQD: 60.0000 mL | Freq: Once | CUTANEOUS | Status: DC

## 2022-11-25 MED ORDER — MENTHOL 3 MG MT LOZG: 1.0000 | LOZENGE | OROMUCOSAL | Status: DC | PRN

## 2022-11-25 MED ORDER — PANTOPRAZOLE SODIUM 40 MG PO TBEC
DELAYED_RELEASE_TABLET | ORAL | Status: AC
  Administered 2022-11-25: 40 mg via ORAL
  Filled 2022-11-25: qty 1

## 2022-11-25 MED ORDER — KETAMINE HCL 10 MG/ML IJ SOLN
INTRAMUSCULAR | Status: DC | PRN
  Administered 2022-11-25: 20 mg via INTRAVENOUS

## 2022-11-25 MED ORDER — GABAPENTIN 300 MG PO CAPS
ORAL_CAPSULE | ORAL | Status: AC
  Administered 2022-11-25: 300 mg via ORAL
  Filled 2022-11-25: qty 1

## 2022-11-25 MED ORDER — ACETAMINOPHEN 10 MG/ML IV SOLN
INTRAVENOUS | Status: DC | PRN
  Administered 2022-11-25: 1000 mg via INTRAVENOUS

## 2022-11-25 MED ORDER — OXYCODONE HCL 5 MG PO TABS: 5.0000 mg | ORAL_TABLET | Freq: Once | ORAL | Status: DC | PRN

## 2022-11-25 MED ORDER — TRANEXAMIC ACID-NACL 1000-0.7 MG/100ML-% IV SOLN
INTRAVENOUS | Status: AC
  Administered 2022-11-25: 1000 mg via INTRAVENOUS
  Filled 2022-11-25: qty 100

## 2022-11-25 MED ORDER — ACETAMINOPHEN 10 MG/ML IV SOLN
INTRAVENOUS | Status: AC
  Filled 2022-11-25: qty 100

## 2022-11-25 MED ORDER — SODIUM CHLORIDE FLUSH 0.9 % IV SOLN
INTRAVENOUS | Status: AC
  Filled 2022-11-25: qty 40

## 2022-11-25 MED ORDER — PHENYLEPHRINE HCL-NACL 20-0.9 MG/250ML-% IV SOLN
INTRAVENOUS | Status: DC | PRN
  Administered 2022-11-25: 10 ug/min via INTRAVENOUS

## 2022-11-25 MED ORDER — FERROUS SULFATE 325 (65 FE) MG PO TABS
ORAL_TABLET | ORAL | Status: AC
  Filled 2022-11-25: qty 1

## 2022-11-25 MED ORDER — TRANEXAMIC ACID-NACL 1000-0.7 MG/100ML-% IV SOLN
INTRAVENOUS | Status: AC
  Filled 2022-11-25: qty 100

## 2022-11-25 MED ORDER — CEFAZOLIN SODIUM-DEXTROSE 2-4 GM/100ML-% IV SOLN
2.0000 g | Freq: Four times a day (QID) | INTRAVENOUS | Status: AC
  Administered 2022-11-25: 2 g via INTRAVENOUS

## 2022-11-25 MED ORDER — OXYCODONE HCL 5 MG PO TABS
10.0000 mg | ORAL_TABLET | ORAL | Status: DC | PRN
  Administered 2022-11-25: 10 mg via ORAL

## 2022-11-25 MED ORDER — MAGNESIUM HYDROXIDE 400 MG/5ML PO SUSP: 30.0000 mL | Freq: Every day | ORAL | Status: DC

## 2022-11-25 MED ORDER — CEFAZOLIN SODIUM-DEXTROSE 2-4 GM/100ML-% IV SOLN
2.0000 g | INTRAVENOUS | Status: AC
  Administered 2022-11-25: 2 g via INTRAVENOUS

## 2022-11-25 MED ORDER — BISACODYL 10 MG RE SUPP: 10.0000 mg | Freq: Every day | RECTAL | Status: DC | PRN

## 2022-11-25 MED ORDER — CHLORHEXIDINE GLUCONATE 0.12 % MT SOLN
OROMUCOSAL | Status: AC
  Administered 2022-11-25: 15 mL via OROMUCOSAL
  Filled 2022-11-25: qty 15

## 2022-11-25 MED ORDER — ORAL CARE MOUTH RINSE: 15.0000 mL | Freq: Once | OROMUCOSAL | Status: AC

## 2022-11-25 MED ORDER — ATORVASTATIN CALCIUM 20 MG PO TABS
ORAL_TABLET | ORAL | Status: AC
  Filled 2022-11-25: qty 2

## 2022-11-25 MED ORDER — PROPOFOL 1000 MG/100ML IV EMUL
INTRAVENOUS | Status: AC
  Filled 2022-11-25: qty 100

## 2022-11-25 MED ORDER — CHLORHEXIDINE GLUCONATE 0.12 % MT SOLN: 15.0000 mL | Freq: Once | OROMUCOSAL | Status: AC

## 2022-11-25 MED ORDER — PHENYLEPHRINE HCL-NACL 20-0.9 MG/250ML-% IV SOLN
INTRAVENOUS | Status: AC
  Filled 2022-11-25: qty 250

## 2022-11-25 MED ORDER — METOCLOPRAMIDE HCL 10 MG PO TABS
10.0000 mg | ORAL_TABLET | Freq: Three times a day (TID) | ORAL | Status: DC
  Administered 2022-11-25: 10 mg via ORAL

## 2022-11-25 MED ORDER — STERILE WATER FOR IRRIGATION IR SOLN
Status: DC | PRN
  Administered 2022-11-25: 1000 mL

## 2022-11-25 MED ORDER — NIFEDIPINE ER OSMOTIC RELEASE 30 MG PO TB24
30.0000 mg | ORAL_TABLET | Freq: Every day | ORAL | Status: DC
  Administered 2022-11-25 – 2022-11-26 (×2): 30 mg via ORAL
  Filled 2022-11-25 (×2): qty 1

## 2022-11-25 MED ORDER — METOCLOPRAMIDE HCL 10 MG PO TABS
ORAL_TABLET | ORAL | Status: AC
  Administered 2022-11-25: 10 mg via ORAL
  Filled 2022-11-25: qty 1

## 2022-11-25 MED ORDER — PROPOFOL 500 MG/50ML IV EMUL
INTRAVENOUS | Status: DC | PRN
  Administered 2022-11-25: 50 ug/kg/min via INTRAVENOUS

## 2022-11-25 MED ORDER — CELECOXIB 200 MG PO CAPS
ORAL_CAPSULE | ORAL | Status: AC
  Administered 2022-11-25: 200 mg via ORAL
  Filled 2022-11-25: qty 1

## 2022-11-25 MED ORDER — LEVOTHYROXINE SODIUM 75 MCG PO TABS
75.0000 ug | ORAL_TABLET | Freq: Every day | ORAL | Status: DC
  Administered 2022-11-26: 75 ug via ORAL
  Filled 2022-11-25: qty 1

## 2022-11-25 MED ORDER — TRANEXAMIC ACID-NACL 1000-0.7 MG/100ML-% IV SOLN
1000.0000 mg | INTRAVENOUS | Status: AC
  Administered 2022-11-25: 1000 mg via INTRAVENOUS

## 2022-11-25 MED ORDER — TRANEXAMIC ACID-NACL 1000-0.7 MG/100ML-% IV SOLN: 1000.0000 mg | Freq: Once | INTRAVENOUS | Status: AC

## 2022-11-25 MED ORDER — ONDANSETRON HCL 4 MG/2ML IJ SOLN
INTRAMUSCULAR | Status: DC | PRN
  Administered 2022-11-25: 4 mg via INTRAVENOUS

## 2022-11-25 MED ORDER — VENLAFAXINE HCL ER 150 MG PO CP24
150.0000 mg | ORAL_CAPSULE | Freq: Every day | ORAL | Status: DC
  Administered 2022-11-25: 150 mg via ORAL
  Filled 2022-11-25: qty 1

## 2022-11-25 MED ORDER — ATORVASTATIN CALCIUM 20 MG PO TABS
40.0000 mg | ORAL_TABLET | Freq: Every day | ORAL | Status: DC
  Administered 2022-11-25: 40 mg via ORAL

## 2022-11-25 MED ORDER — SENNOSIDES-DOCUSATE SODIUM 8.6-50 MG PO TABS
ORAL_TABLET | ORAL | Status: AC
  Administered 2022-11-25: 1 via ORAL
  Filled 2022-11-25: qty 1

## 2022-11-25 MED ORDER — GABAPENTIN 300 MG PO CAPS: 300.0000 mg | ORAL_CAPSULE | Freq: Once | ORAL | Status: DC

## 2022-11-25 MED ORDER — CEFAZOLIN SODIUM-DEXTROSE 2-4 GM/100ML-% IV SOLN
INTRAVENOUS | Status: AC
  Filled 2022-11-25: qty 100

## 2022-11-25 MED ORDER — DIPHENHYDRAMINE HCL 12.5 MG/5ML PO ELIX: 12.5000 mg | ORAL_SOLUTION | ORAL | Status: DC | PRN

## 2022-11-25 MED ORDER — SENNOSIDES-DOCUSATE SODIUM 8.6-50 MG PO TABS: 1.0000 | ORAL_TABLET | Freq: Two times a day (BID) | ORAL | Status: DC

## 2022-11-25 MED ORDER — ONDANSETRON HCL 4 MG PO TABS: 4.0000 mg | ORAL_TABLET | Freq: Four times a day (QID) | ORAL | Status: DC | PRN

## 2022-11-25 MED ORDER — OXYCODONE HCL 5 MG PO TABS
ORAL_TABLET | ORAL | Status: AC
  Filled 2022-11-25: qty 2

## 2022-11-25 MED ORDER — FLUTICASONE PROPIONATE 50 MCG/ACT NA SUSP: 2.0000 | NASAL | Status: DC | PRN

## 2022-11-25 MED ORDER — ACETAMINOPHEN 325 MG PO TABS: 325.0000 mg | ORAL_TABLET | Freq: Four times a day (QID) | ORAL | Status: DC | PRN

## 2022-11-25 MED ORDER — DEXMEDETOMIDINE HCL IN NACL 80 MCG/20ML IV SOLN
INTRAVENOUS | Status: DC | PRN
  Administered 2022-11-25: 12 ug via BUCCAL
  Administered 2022-11-25: 8 ug via BUCCAL

## 2022-11-25 MED ORDER — OXYCODONE HCL 5 MG/5ML PO SOLN: 5.0000 mg | Freq: Once | ORAL | Status: DC | PRN

## 2022-11-25 MED ORDER — ALUM & MAG HYDROXIDE-SIMETH 200-200-20 MG/5ML PO SUSP: 30.0000 mL | ORAL | Status: DC | PRN

## 2022-11-25 MED ORDER — ACETAMINOPHEN 10 MG/ML IV SOLN
INTRAVENOUS | Status: AC
  Administered 2022-11-25: 1000 mg via INTRAVENOUS
  Filled 2022-11-25: qty 100

## 2022-11-25 MED ORDER — IRRISEPT - 450ML BOTTLE WITH 0.05% CHG IN STERILE WATER, USP 99.95% OPTIME
TOPICAL | Status: DC | PRN
  Administered 2022-11-25: 450 mL

## 2022-11-25 MED ORDER — SODIUM CHLORIDE 0.9 % IV SOLN: INTRAVENOUS | Status: DC

## 2022-11-25 MED ORDER — BUPIVACAINE HCL (PF) 0.5 % IJ SOLN
INTRAMUSCULAR | Status: DC | PRN
  Administered 2022-11-25: 2.6 mL

## 2022-11-25 MED ORDER — PHENOL 1.4 % MT LIQD: 1.0000 | OROMUCOSAL | Status: DC | PRN

## 2022-11-25 MED ORDER — PROPOFOL 10 MG/ML IV BOLUS
INTRAVENOUS | Status: DC | PRN
  Administered 2022-11-25: 50 mg via INTRAVENOUS
  Administered 2022-11-25: 20 mg via INTRAVENOUS
  Administered 2022-11-25: 30 mg via INTRAVENOUS

## 2022-11-25 MED ORDER — POLYVINYL ALCOHOL 1.4 % OP SOLN: 1.0000 [drp] | OPHTHALMIC | Status: DC | PRN

## 2022-11-25 MED ORDER — FENTANYL CITRATE (PF) 100 MCG/2ML IJ SOLN: 25.0000 ug | INTRAMUSCULAR | Status: DC | PRN

## 2022-11-25 MED ORDER — LIDOCAINE HCL (CARDIAC) PF 100 MG/5ML IV SOSY
PREFILLED_SYRINGE | INTRAVENOUS | Status: DC | PRN
  Administered 2022-11-25: 50 mg via INTRAVENOUS

## 2022-11-25 MED ORDER — BUPIVACAINE HCL (PF) 0.5 % IJ SOLN
INTRAMUSCULAR | Status: AC
  Filled 2022-11-25: qty 10

## 2022-11-25 MED ORDER — OXYCODONE HCL 5 MG PO TABS
ORAL_TABLET | ORAL | Status: AC
  Administered 2022-11-25: 5 mg via ORAL
  Filled 2022-11-25: qty 1

## 2022-11-25 MED ORDER — FENOFIBRATE 54 MG PO TABS
54.0000 mg | ORAL_TABLET | Freq: Every day | ORAL | Status: DC
  Administered 2022-11-26: 54 mg via ORAL
  Filled 2022-11-25: qty 1

## 2022-11-25 MED ORDER — SODIUM CHLORIDE (PF) 0.9 % IJ SOLN
INTRAMUSCULAR | Status: DC | PRN
  Administered 2022-11-25: 120 mL via INTRAMUSCULAR

## 2022-11-25 MED ORDER — SODIUM CHLORIDE 0.9 % IR SOLN
Status: DC | PRN
  Administered 2022-11-25: 3000 mL

## 2022-11-25 MED ORDER — ENOXAPARIN SODIUM 30 MG/0.3ML IJ SOSY: 30.0000 mg | PREFILLED_SYRINGE | Freq: Two times a day (BID) | INTRAMUSCULAR | Status: DC

## 2022-11-25 MED ORDER — HYDROMORPHONE HCL 1 MG/ML IJ SOLN: 0.5000 mg | INTRAMUSCULAR | Status: DC | PRN

## 2022-11-25 MED ORDER — CEFAZOLIN SODIUM-DEXTROSE 2-4 GM/100ML-% IV SOLN
INTRAVENOUS | Status: AC
  Administered 2022-11-25: 2 g via INTRAVENOUS
  Filled 2022-11-25: qty 100

## 2022-11-25 MED ORDER — METOCLOPRAMIDE HCL 10 MG PO TABS
ORAL_TABLET | ORAL | Status: AC
  Filled 2022-11-25: qty 1

## 2022-11-25 MED ORDER — CELECOXIB 200 MG PO CAPS
ORAL_CAPSULE | ORAL | Status: AC
  Administered 2022-11-25: 400 mg via ORAL
  Filled 2022-11-25: qty 2

## 2022-11-25 MED ORDER — TRAMADOL HCL 50 MG PO TABS: 50.0000 mg | ORAL_TABLET | ORAL | Status: DC | PRN

## 2022-11-25 MED ORDER — CELECOXIB 200 MG PO CAPS: 400.0000 mg | ORAL_CAPSULE | Freq: Once | ORAL | Status: AC

## 2022-11-25 MED ORDER — ONDANSETRON HCL 4 MG/2ML IJ SOLN: 4.0000 mg | Freq: Four times a day (QID) | INTRAMUSCULAR | Status: DC | PRN

## 2022-11-25 MED ORDER — GABAPENTIN 300 MG PO CAPS: 300.0000 mg | ORAL_CAPSULE | Freq: Once | ORAL | Status: AC

## 2022-11-25 SURGICAL SUPPLY — 72 items
ATTUNE MED DOME PAT 32 KNEE (Knees) IMPLANT
ATTUNE PSFEM LTSZ6 NARCEM KNEE (Femur) IMPLANT
ATTUNE PSRP INSR SZ6 5 KNEE (Insert) IMPLANT
BASEPLATE TIBIAL ROTATING SZ 4 (Knees) IMPLANT
BATTERY INSTRU NAVIGATION (MISCELLANEOUS) ×4 IMPLANT
BLADE SAW 70X12.5 (BLADE) ×1 IMPLANT
BLADE SAW 90X13X1.19 OSCILLAT (BLADE) ×1 IMPLANT
BLADE SAW 90X25X1.19 OSCILLAT (BLADE) ×1 IMPLANT
BONE CEMENT GENTAMICIN (Cement) ×2 IMPLANT
CEMENT BONE GENTAMICIN 40 (Cement) IMPLANT
CEMENT HV SMART SET (Cement) IMPLANT
COOLER POLAR GLACIER W/PUMP (MISCELLANEOUS) ×1 IMPLANT
CUFF TOURN SGL QUICK 24 (TOURNIQUET CUFF)
CUFF TOURN SGL QUICK 34 (TOURNIQUET CUFF)
CUFF TRNQT CYL 24X4X16.5-23 (TOURNIQUET CUFF) IMPLANT
CUFF TRNQT CYL 34X4.125X (TOURNIQUET CUFF) IMPLANT
DRAPE 3/4 80X56 (DRAPES) ×1 IMPLANT
DRAPE INCISE IOBAN 66X45 STRL (DRAPES) IMPLANT
DRSG DERMACEA NONADH 3X8 (GAUZE/BANDAGES/DRESSINGS) ×1 IMPLANT
DRSG MEPILEX SACRM 8.7X9.8 (GAUZE/BANDAGES/DRESSINGS) ×1 IMPLANT
DRSG OPSITE POSTOP 4X14 (GAUZE/BANDAGES/DRESSINGS) ×1 IMPLANT
DRSG TEGADERM 4X4.75 (GAUZE/BANDAGES/DRESSINGS) ×1 IMPLANT
DURAPREP 26ML APPLICATOR (WOUND CARE) ×2 IMPLANT
ELECT CAUTERY BLADE 6.4 (BLADE) ×1 IMPLANT
ELECT REM PT RETURN 9FT ADLT (ELECTROSURGICAL) ×1
ELECTRODE REM PT RTRN 9FT ADLT (ELECTROSURGICAL) ×1 IMPLANT
EX-PIN ORTHOLOCK NAV 4X150 (PIN) ×2 IMPLANT
GLOVE BIOGEL M STRL SZ7.5 (GLOVE) ×2 IMPLANT
GLOVE SRG 8 PF TXTR STRL LF DI (GLOVE) ×1 IMPLANT
GLOVE SURG UNDER POLY LF SZ8 (GLOVE) ×1
GOWN STRL REUS W/ TWL LRG LVL3 (GOWN DISPOSABLE) ×1 IMPLANT
GOWN STRL REUS W/TWL LRG LVL3 (GOWN DISPOSABLE) ×1
GOWN TOGA ZIPPER T7+ PEEL AWAY (MISCELLANEOUS) ×2 IMPLANT
HEMOVAC 400CC 10FR (MISCELLANEOUS) ×1 IMPLANT
HOLDER FOLEY CATH W/STRAP (MISCELLANEOUS) ×1 IMPLANT
HOOD PEEL AWAY T7 (MISCELLANEOUS) IMPLANT
IV NS IRRIG 3000ML ARTHROMATIC (IV SOLUTION) ×1 IMPLANT
JET LAVAGE IRRISEPT WOUND (IRRIGATION / IRRIGATOR) ×1
KIT TURNOVER KIT A (KITS) ×1 IMPLANT
KNIFE SCULPS 14X20 (INSTRUMENTS) ×1 IMPLANT
LAVAGE JET IRRISEPT WOUND (IRRIGATION / IRRIGATOR) IMPLANT
MANIFOLD NEPTUNE II (INSTRUMENTS) ×2 IMPLANT
NDL SPNL 20GX3.5 QUINCKE YW (NEEDLE) ×2 IMPLANT
NEEDLE SPNL 20GX3.5 QUINCKE YW (NEEDLE) ×2 IMPLANT
NS IRRIG 500ML POUR BTL (IV SOLUTION) ×1 IMPLANT
PACK TOTAL KNEE (MISCELLANEOUS) ×1 IMPLANT
PAD ABD DERMACEA PRESS 5X9 (GAUZE/BANDAGES/DRESSINGS) ×2 IMPLANT
PAD WRAPON POLAR KNEE (MISCELLANEOUS) ×1 IMPLANT
PIN DRILL FIX HALF THREAD (BIT) ×2 IMPLANT
PIN DRILL QUICK PACK ×2 IMPLANT
PIN FIXATION 1/8DIA X 3INL (PIN) ×1 IMPLANT
PULSAVAC PLUS IRRIG FAN TIP (DISPOSABLE) ×1
SOL PREP PVP 2OZ (MISCELLANEOUS) ×1
SOLUTION IRRIG SURGIPHOR (IV SOLUTION) ×1 IMPLANT
SOLUTION PREP PVP 2OZ (MISCELLANEOUS) ×1 IMPLANT
SPONGE DRAIN TRACH 4X4 STRL 2S (GAUZE/BANDAGES/DRESSINGS) ×1 IMPLANT
STAPLER SKIN PROX 35W (STAPLE) ×1 IMPLANT
STOCKINETTE IMPERV 14X48 (MISCELLANEOUS) ×1 IMPLANT
STRAP TIBIA SHORT (MISCELLANEOUS) ×1 IMPLANT
SUCTION FRAZIER HANDLE 10FR (MISCELLANEOUS) ×1
SUCTION TUBE FRAZIER 10FR DISP (MISCELLANEOUS) ×1 IMPLANT
SUT VIC AB 0 CT1 36 (SUTURE) ×1 IMPLANT
SUT VIC AB 1 CT1 36 (SUTURE) ×2 IMPLANT
SUT VIC AB 2-0 CT2 27 (SUTURE) ×1 IMPLANT
SYR 30ML LL (SYRINGE) ×2 IMPLANT
TIP FAN IRRIG PULSAVAC PLUS (DISPOSABLE) ×1 IMPLANT
TOWEL OR 17X26 4PK STRL BLUE (TOWEL DISPOSABLE) IMPLANT
TOWER CARTRIDGE SMART MIX (DISPOSABLE) ×1 IMPLANT
TRAP FLUID SMOKE EVACUATOR (MISCELLANEOUS) ×1 IMPLANT
TRAY FOLEY MTR SLVR 16FR STAT (SET/KITS/TRAYS/PACK) ×1 IMPLANT
WATER STERILE IRR 1000ML POUR (IV SOLUTION) ×1 IMPLANT
WRAPON POLAR PAD KNEE (MISCELLANEOUS) ×1

## 2022-11-25 NOTE — Progress Notes (Signed)
Dr. Wynetta Emery: anesthesia aware of HR 104-116, bp at baseline, no c/o's chest pain/shortness of breath.

## 2022-11-25 NOTE — Interval H&P Note (Signed)
History and Physical Interval Note:  11/25/2022 10:59 AM  Veronica Hoffman  has presented today for surgery, with the diagnosis of PRIMARY OSTEOARTHRITIS OF LEFT KNEE..  The various methods of treatment have been discussed with the patient and family. After consideration of risks, benefits and other options for treatment, the patient has consented to  Procedure(s) with comments: [place] (Left) - RNFA requested as a surgical intervention.  The patient's history has been reviewed, patient examined, no change in status, stable for surgery.  I have reviewed the patient's chart and labs.  Questions were answered to the patient's satisfaction.     [place]

## 2022-11-25 NOTE — Evaluation (Signed)
Physical Therapy Evaluation Patient Details Name: Veronica Hoffman MRN: [id number] DOB: 08/14/1941 Today's Date: 11/25/2022  History of Present Illness  82 y/o female s/p L TKA 11/25/22, h/o R TKA in 2017.  Clinical Impression  Pt did very well with POD0 PT eval and subsequent treatment.  She was able to perform great QS and SLRs w/o hesitation and did well with other AROM and resisted exercises.  She was able to do all mobility w/o direct assist though she did need some increased cuing and reminders for hand placement and set up during sit/stand efforts.  Pt was able to ambulate 100 ft with RW showing good speed, cadence, confidence and safety, had nearly 90* of flexion and has met or surpassed typical POD 1 expectations.  Will benefit from continued PT per TKA protocol.      Recommendations for follow up therapy are one component of a multi-disciplinary discharge planning process, led by the attending physician.  Recommendations may be updated based on patient status, additional functional criteria and insurance authorization.  Follow Up Recommendations Home health PT      Assistance Recommended at Discharge Intermittent Supervision/Assistance  Patient can return home with the following  Help with stairs or ramp for entrance;Assist for transportation;Assistance with cooking/housework;A little help with bathing/dressing/bathroom;A little help with walking and/or transfers    Equipment Recommendations None recommended by PT  Recommendations for Other Services       Functional Status Assessment Patient has had a recent decline in their functional status and demonstrates the ability to make significant improvements in function in a reasonable and predictable amount of time.     Precautions / Restrictions Precautions Precautions: Fall Restrictions Weight Bearing Restrictions: Yes LLE Weight Bearing: Weight bearing as tolerated      Mobility  Bed Mobility Overal bed mobility: Modified  Independent             General bed mobility comments: Pt able to get herself to sitting w/o assist or hesitation    Transfers Overall transfer level: Needs assistance Equipment used: Rolling walker (2 wheels) Transfers: Sit to/from Stand Sit to Stand: Supervision           General transfer comment: Pt needed reminders for appropriate set up (U&LEs) but was able to rise to standing multiple times t/o session from various surfaces w/o phyiscal assist    Ambulation/Gait Ambulation/Gait assistance: Supervision Gait Distance (Feet): 100 Feet Assistive device: Rolling walker (2 wheels)         General Gait Details: Pt was able to assume consistent cadence and constant walker momentum quickly and maintained appropriate cadence t/o the effort.  Stairs            Wheelchair Mobility    Modified Rankin (Stroke Patients Only)       Balance Overall balance assessment: Needs assistance Sitting-balance support: No upper extremity supported Sitting balance-Leahy Scale: Normal     Standing balance support: Bilateral upper extremity supported, During functional activity Standing balance-Leahy Scale: Good                               Pertinent Vitals/Pain Pain Assessment Pain Assessment: 0-10 Pain Score: 2  Pain Location: L knee (reports pain as mild, slight increase with ROM tasks)    Home Living Family/patient expects to be discharged to:: Private residence Living Arrangements: Alone Available Help at Discharge: Family;Available 24 hours/day (brother and sister are going to stay with her)  Home Access: Stairs to enter Entrance Stairs-Rails: Can reach both Entrance Stairs-Number of Steps: 3   Home Layout: One level Home Equipment: Conservation officer, nature (2 wheels);BSC/3in1      Prior Function Prior Level of Function : Independent/Modified Independent;Driving;Working/employed             Mobility Comments: Pt reports that she stays very active,  mowing yard, running beauty salon, etc       Hand Dominance        Extremity/Trunk Assessment   Upper Extremity Assessment Upper Extremity Assessment: Overall WFL for tasks assessed    Lower Extremity Assessment Lower Extremity Assessment: Overall WFL for tasks assessed (expected post-op weakness)       Communication   Communication: No difficulties  Cognition Arousal/Alertness: Awake/alert Behavior During Therapy: WFL for tasks assessed/performed Overall Cognitive Status: Within Functional Limits for tasks assessed                                          General Comments General comments (skin integrity, edema, etc.): Pt pleasant, talkative and eager to work with PT    Exercises Total Joint Exercises Ankle Circles/Pumps: AROM, 10 reps Quad Sets: Strengthening, 10 reps Heel Slides: AROM, 5 reps (lightly resisted leg ext) Hip ABduction/ADduction: Strengthening, 10 reps Straight Leg Raises: AROM, 10 reps Knee Flexion: PROM, 5 reps Goniometric ROM: AROM 0-79, PROM to 87*   Assessment/Plan    PT Assessment Patient needs continued PT services  PT Problem List Decreased strength;Decreased range of motion;Decreased activity tolerance;Decreased balance;Decreased mobility;Decreased coordination;Decreased safety awareness;Decreased knowledge of use of DME;Pain       PT Treatment Interventions Gait training;DME instruction;Stair training;Functional mobility training;Therapeutic activities;Therapeutic exercise;Balance training;Neuromuscular re-education;Patient/family education    PT Goals (Current goals can be found in the Care Plan section)  Acute Rehab PT Goals Patient Stated Goal: go home PT Goal Formulation: With patient Time For Goal Achievement: 12/08/22 Potential to Achieve Goals: Good    Frequency BID     Co-evaluation               AM-PAC PT "6 Clicks" Mobility  Outcome Measure Help needed turning from your back to your side while  in a flat bed without using bedrails?: None Help needed moving from lying on your back to sitting on the side of a flat bed without using bedrails?: None Help needed moving to and from a bed to a chair (including a wheelchair)?: None Help needed standing up from a chair using your arms (e.g., wheelchair or bedside chair)?: None Help needed to walk in hospital room?: A Little Help needed climbing 3-5 steps with a railing? : A Little 6 Click Score: 22    End of Session Equipment Utilized During Treatment: Gait belt Activity Tolerance: Patient tolerated treatment well Patient left: in chair;with call bell/phone within reach;with family/visitor present Nurse Communication: Mobility status PT Visit Diagnosis: Muscle weakness (generalized) (M62.81);Difficulty in walking, not elsewhere classified (R26.2);Pain Pain - Right/Left: Left Pain - part of body: Knee    Time: 7989-2119 PT Time Calculation (min) (ACUTE ONLY): 42 min   Charges:   PT Evaluation $PT Eval Low Complexity: 1 Low PT Treatments $Gait Training: 8-22 mins $Therapeutic Exercise: 8-22 mins        Kreg Shropshire, DPT 11/25/2022, 5:28 PM

## 2022-11-25 NOTE — Op Note (Signed)
OPERATIVE NOTE  DATE OF SURGERY:  11/25/2022  PATIENT NAME:  AMIJAH TIMOTHY   DOB: Feb 02, 1941  MRN: [id number]  PRE-OPERATIVE DIAGNOSIS: Degenerative arthrosis of the left knee, primary  POST-OPERATIVE DIAGNOSIS:  Same  PROCEDURE:  Left total knee arthroplasty using computer-assisted navigation  SURGEON:  Marciano Sequin. M.D.  ANESTHESIA: spinal  ESTIMATED BLOOD LOSS: 50 mL  FLUIDS REPLACED: 1000 mL of crystalloid  TOURNIQUET TIME: 77 minutes  DRAINS: 2 medium Hemovac drains  SOFT TISSUE RELEASES: Anterior cruciate ligament, posterior cruciate ligament, deep medial collateral ligament, patellofemoral ligament  IMPLANTS UTILIZED: DePuy Attune size 6N posterior stabilized femoral component (cemented), size 4 rotating platform tibial component (cemented), 32 mm medialized dome patella (cemented), and a 5 mm stabilized rotating platform polyethylene insert.  INDICATIONS FOR SURGERY: CLEMENTINA MARENO is a 82 y.o. year old female with a long history of progressive knee pain. X-rays demonstrated severe degenerative changes in tricompartmental fashion. The patient had not seen any significant improvement despite conservative nonsurgical intervention. After discussion of the risks and benefits of surgical intervention, the patient expressed understanding of the risks benefits and agree with plans for total knee arthroplasty.   The risks, benefits, and alternatives were discussed at length including but not limited to the risks of infection, bleeding, nerve injury, stiffness, blood clots, the need for revision surgery, cardiopulmonary complications, among others, and they were willing to proceed.  PROCEDURE IN DETAIL: The patient was brought into the operating room and, after adequate spinal anesthesia was achieved, a tourniquet was placed on the patient's upper thigh. The patient's knee and leg were cleaned and prepped with alcohol and DuraPrep and draped in the usual sterile fashion. A  "timeout" was performed as per usual protocol. The lower extremity was exsanguinated using an Esmarch, and the tourniquet was inflated to 300 mmHg. An anterior longitudinal incision was made followed by a standard mid vastus approach. The deep fibers of the medial collateral ligament were elevated in a subperiosteal fashion off of the medial flare of the tibia so as to maintain a continuous soft tissue sleeve. The patella was subluxed laterally and the patellofemoral ligament was incised. Inspection of the knee demonstrated severe degenerative changes with full-thickness loss of articular cartilage. Osteophytes were debrided using a rongeur. Anterior and posterior cruciate ligaments were excised. Two 4.0 mm Schanz pins were inserted in the femur and into the tibia for attachment of the array of trackers used for computer-assisted navigation. Hip center was identified using a circumduction technique. Distal landmarks were mapped using the computer. The distal femur and proximal tibia were mapped using the computer. The distal femoral cutting guide was positioned using computer-assisted navigation so as to achieve a 5 distal valgus cut. The femur was sized and it was felt that a size 6N femoral component was appropriate. A size 6 femoral cutting guide was positioned and the anterior cut was performed and verified using the computer. This was followed by completion of the posterior and chamfer cuts. Femoral cutting guide for the central box was then positioned in the center box cut was performed.  Attention was then directed to the proximal tibia. Medial and lateral menisci were excised. The extramedullary tibial cutting guide was positioned using computer-assisted navigation so as to achieve a 0 varus-valgus alignment and 3 posterior slope. The cut was performed and verified using the computer. The proximal tibia was sized and it was felt that a size 4 tibial tray was appropriate. Tibial and femoral trials were  inserted followed  by insertion of a 5 mm polyethylene insert. This allowed for excellent mediolateral soft tissue balancing both in flexion and in full extension. Finally, the patella was cut and prepared so as to accommodate a 32 mm medialized dome patella. A patella trial was placed and the knee was placed through a range of motion with excellent patellar tracking appreciated. The femoral trial was removed after debridement of posterior osteophytes. The central post-hole for the tibial component was reamed followed by insertion of a keel punch. Tibial trials were then removed. Cut surfaces of bone were irrigated with copious amounts of normal saline using pulsatile lavage and then suctioned dry. Polymethylmethacrylate cement with gentamicin was prepared in the usual fashion using a vacuum mixer. Cement was applied to the cut surface of the proximal tibia as well as along the undersurface of a size 4 rotating platform tibial component. Tibial component was positioned and impacted into place. Excess cement was removed using [employer] fast streamer. Cement was then applied to the cut surfaces of the femur as well as along the posterior flanges of the size 6N femoral component. The femoral component was positioned and impacted into place. Excess cement was removed using [employer] fast streamer. A 5 mm polyethylene trial was inserted and the knee was brought into full extension with steady axial compression applied. Finally, cement was applied to the backside of a 32 mm medialized dome patella and the patellar component was positioned and patellar clamp applied. Excess cement was removed using [employer] fast streamer. After adequate curing of the cement, the tourniquet was deflated after a total tourniquet time of 77 minutes. Hemostasis was achieved using electrocautery. The knee was irrigated with copious amounts of normal saline using pulsatile lavage followed by 450 ml of Surgiphor and then suctioned dry. 20 mL of 1.3% Exparel and 60 mL of  0.25% Marcaine in 40 mL of normal saline was injected along the posterior capsule, medial and lateral gutters, and along the arthrotomy site. A 5 mm stabilized rotating platform polyethylene insert was inserted and the knee was placed through a range of motion with excellent mediolateral soft tissue balancing appreciated and excellent patellar tracking noted. 2 medium drains were placed in the wound bed and brought out through separate stab incisions. The medial parapatellar portion of the incision was reapproximated using interrupted sutures of #1 Vicryl. Subcutaneous tissue was approximated in layers using first #0 Vicryl followed #2-0 Vicryl. The skin was approximated with skin staples. A sterile dressing was applied.  The patient tolerated the procedure well and was transported to the recovery room in stable condition.    Thessaly Mccullers P. Holley Bouche., M.D.

## 2022-11-25 NOTE — Anesthesia Preprocedure Evaluation (Addendum)
Anesthesia Evaluation  Patient identified by MRN, date of birth, ID band Patient awake    Reviewed: Allergy & Precautions, NPO status , Patient's Chart, lab work & pertinent test results  History of Anesthesia Complications (+) PONV and history of anesthetic complications  Airway Mallampati: II  TM Distance: >3 FB Neck ROM: full    Dental  (+) Partial Upper   Pulmonary neg pulmonary ROS   Pulmonary exam normal        Cardiovascular hypertension, On Medications + Peripheral Vascular Disease  Normal cardiovascular exam     Neuro/Psych  PSYCHIATRIC DISORDERS Anxiety Depression     Neuromuscular disease    GI/Hepatic Neg liver ROS,GERD  Medicated,,  Endo/Other  Hypothyroidism    Renal/GU      Musculoskeletal   Abdominal   Peds  Hematology   Anesthesia Other Findings Hx of scleroderma and Raynaud's   Past Medical History: No date: Anxiety No date: Arthritis 06/25/1986: Breast cancer ([place])     Comment:  rt mastecomy/chemo  and rad No date: Complication of anesthesia     Comment:  occ nausea No date: Depression No date: Deviated septum No date: Elevated cholesterol No date: Hypothyroidism No date: Neuromuscular disorder ([place]) No date: Neuropathy No date: Personal history of chemotherapy No date: Personal history of radiation therapy No date: Pulmonary hypertension ([place]) No date: Raynaud disease No date: Scleroderma ([place]) No date: Spastic colon  Past Surgical History: No date: APPENDECTOMY 06/07/2015: CARDIAC CATHETERIZATION; N/A     Comment:  Procedure: Right Heart Cath;  Surgeon: Teodoro Spray,               MD;  Location: [facility];  Service:               Cardiovascular;  Laterality: N/A; No date: CATARACT EXTRACTION; Bilateral No date: COLONOSCOPY 02/26/2016: KNEE ARTHROPLASTY; Right     Comment:  Procedure: COMPUTER ASSISTED TOTAL KNEE ARTHROPLASTY;                Surgeon: Dereck Leep, MD;  Location: [facility];                Service: Orthopedics;  Laterality: Right; 06/25/1986: MASTECTOMY; Right     Comment:  had chemo/rad No date: NASAL SEPTUM SURGERY     Reproductive/Obstetrics negative OB ROS                             Anesthesia Physical Anesthesia Plan  ASA: 2  Anesthesia Plan: Spinal   Post-op Pain Management: Ofirmev IV (intra-op)* and Toradol IV (intra-op)*   Induction: Intravenous  PONV Risk Score and Plan: 2 and Propofol infusion, TIVA and Treatment may vary due to age or medical condition  Airway Management Planned: Natural Airway and Nasal Cannula  Additional Equipment:   Intra-op Plan:   Post-operative Plan:   Informed Consent: I have reviewed the patients History and Physical, chart, labs and discussed the procedure including the risks, benefits and alternatives for the proposed anesthesia with the patient or authorized representative who has indicated his/her understanding and acceptance.     Dental Advisory Given  Plan Discussed with: Anesthesiologist, CRNA and Surgeon  Anesthesia Plan Comments: (Patient reports no bleeding problems and no anticoagulant use.  Plan for spinal with backup GA  Patient consented for risks of anesthesia including but not limited to:  - adverse reactions to medications - damage to eyes, teeth, lips or other  oral mucosa - nerve damage due to positioning  - risk of bleeding, infection and or nerve damage from spinal that could lead to paralysis - risk of headache or failed spinal - damage to teeth, lips or other oral mucosa - sore throat or hoarseness - damage to heart, brain, nerves, lungs, other parts of body or loss of life  Patient voiced understanding.)        Anesthesia Quick Evaluation

## 2022-11-25 NOTE — Anesthesia Postprocedure Evaluation (Signed)
Anesthesia Post Note  Patient: Veronica Hoffman  Procedure(s) Performed: COMPUTER ASSISTED TOTAL KNEE ARTHROPLASTY (Left: Knee)  Patient location during evaluation: PACU Anesthesia Type: Spinal Level of consciousness: oriented and awake and alert Pain management: pain level controlled Vital Signs Assessment: post-procedure vital signs reviewed and stable Respiratory status: spontaneous breathing, respiratory function stable and patient connected to nasal cannula oxygen Cardiovascular status: blood pressure returned to baseline and stable Postop Assessment: no headache, no backache, no apparent nausea or vomiting and spinal receding Anesthetic complications: no   No notable events documented.   Last Vitals:  Vitals:   11/25/22 1452 11/25/22 1528  BP: 135/83 (!) 152/91  Pulse: (!) 109 (!) 114  Resp: 20   Temp: 36.4 C (!) 36.4 C  SpO2: 100%     Last Pain:  Vitals:   11/25/22 1528  TempSrc: Temporal  PainSc:                  Ilene Qua

## 2022-11-25 NOTE — Progress Notes (Signed)
Patient awake/alert x4. Able to move bil lower ext, pulses intact. Denies any discomfort at this time.  Tolerating po fluids without event.

## 2022-11-25 NOTE — Transfer of Care (Signed)
Immediate Anesthesia Transfer of Care Note  Patient: Veronica Hoffman  Procedure(s) Performed: COMPUTER ASSISTED TOTAL KNEE ARTHROPLASTY (Left: Knee)  Patient Location: PACU  Anesthesia Type:MAC and Spinal  Level of Consciousness: drowsy  Airway & Oxygen Therapy: Patient Spontanous Breathing and Patient connected to face mask oxygen  Post-op Assessment: Report given to RN and Post -op Vital signs reviewed and stable  Post vital signs: Reviewed and stable  Last Vitals:  Vitals Value Taken Time  BP 135/83 11/25/22 1452  Temp    Pulse 111 11/25/22 1456  Resp 20 11/25/22 1456  SpO2 100 % 11/25/22 1456  Vitals shown include unvalidated device data.  Last Pain: There were no vitals filed for this visit.       Complications: No notable events documented.

## 2022-11-25 NOTE — Anesthesia Procedure Notes (Signed)
Spinal  Patient location during procedure: OR Start time: 11/25/2022 11:40 AM End time: 11/25/2022 11:50 AM Reason for block: surgical anesthesia Staffing Performed: resident/CRNA  Resident/CRNA: Demetrius Charity, CRNA Performed by: Demetrius Charity, CRNA Authorized by: Ilene Qua, MD   Preanesthetic Checklist Completed: patient identified, IV checked, site marked, risks and benefits discussed, surgical consent, monitors and equipment checked, pre-op evaluation and timeout performed Spinal Block Patient position: sitting Prep: DuraPrep Patient monitoring: heart rate, cardiac monitor, continuous pulse ox and blood pressure Approach: midline Location: L3-4 Injection technique: single-shot Needle Needle type: Pencan  Needle gauge: 24 G Needle length: 10 cm Catheter size: 24. Assessment Sensory level: T4 Events: CSF return

## 2022-11-25 NOTE — TOC Progression Note (Addendum)
Transition of Care [facility]) - Progression Note    Patient Details  Name: Veronica Hoffman MRN: [id number] Date of Birth: 10/09/1941  [facility]) CM/SW [place], RN Phone Number: 11/25/2022, 4:14 PM  Clinical Narrative:    The patient was set up with [place] for [facility] services prior to surgery by surgeons office The patient reports that she has a RW and 3 in1        Expected Discharge Plan and Services                                               Social Determinants of Health (SDOH) Interventions SDOH Screenings   Food Insecurity: No Food Insecurity (11/25/2022)  Housing: Low Risk  (11/25/2022)  Transportation Needs: No Transportation Needs (11/25/2022)  Utilities: Not At Risk (11/25/2022)  Tobacco Use: Low Risk  (11/25/2022)    Readmission Risk Interventions     No data to display

## 2022-11-26 ENCOUNTER — Encounter: Payer: Self-pay | Admitting: Orthopedic Surgery

## 2022-11-26 DIAGNOSIS — M1712 Unilateral primary osteoarthritis, left knee: Secondary | ICD-10-CM | POA: Diagnosis not present

## 2022-11-26 MED ORDER — ENOXAPARIN SODIUM 40 MG/0.4ML IJ SOSY: 40.0000 mg | PREFILLED_SYRINGE | INTRAMUSCULAR | 0 refills | Status: AC

## 2022-11-26 MED ORDER — METOCLOPRAMIDE HCL 10 MG PO TABS
ORAL_TABLET | ORAL | Status: AC
  Administered 2022-11-26: 10 mg via ORAL
  Filled 2022-11-26: qty 1

## 2022-11-26 MED ORDER — CELECOXIB 200 MG PO CAPS: 200.0000 mg | ORAL_CAPSULE | Freq: Two times a day (BID) | ORAL | 1 refills | Status: AC

## 2022-11-26 MED ORDER — ACETAMINOPHEN 10 MG/ML IV SOLN
INTRAVENOUS | Status: AC
  Administered 2022-11-26: 1000 mg via INTRAVENOUS
  Filled 2022-11-26: qty 100

## 2022-11-26 MED ORDER — SENNOSIDES-DOCUSATE SODIUM 8.6-50 MG PO TABS
ORAL_TABLET | ORAL | Status: AC
  Administered 2022-11-26: 1 via ORAL
  Filled 2022-11-26: qty 1

## 2022-11-26 MED ORDER — CELECOXIB 200 MG PO CAPS
ORAL_CAPSULE | ORAL | Status: AC
  Administered 2022-11-26: 200 mg via ORAL
  Filled 2022-11-26: qty 1

## 2022-11-26 MED ORDER — OXYCODONE HCL 5 MG PO TABS
ORAL_TABLET | ORAL | Status: AC
  Administered 2022-11-26: 5 mg via ORAL
  Filled 2022-11-26: qty 1

## 2022-11-26 MED ORDER — PANTOPRAZOLE SODIUM 40 MG PO TBEC
DELAYED_RELEASE_TABLET | ORAL | Status: AC
  Administered 2022-11-26: 40 mg via ORAL
  Filled 2022-11-26: qty 1

## 2022-11-26 MED ORDER — ENOXAPARIN SODIUM 30 MG/0.3ML IJ SOSY
PREFILLED_SYRINGE | INTRAMUSCULAR | Status: AC
  Administered 2022-11-26: 30 mg via SUBCUTANEOUS
  Filled 2022-11-26: qty 0.3

## 2022-11-26 MED ORDER — FERROUS SULFATE 325 (65 FE) MG PO TABS
ORAL_TABLET | ORAL | Status: AC
  Administered 2022-11-26: 325 mg via ORAL
  Filled 2022-11-26: qty 1

## 2022-11-26 MED ORDER — TRAMADOL HCL 50 MG PO TABS: 50.0000 mg | ORAL_TABLET | ORAL | 0 refills | Status: AC | PRN

## 2022-11-26 MED ORDER — OXYCODONE HCL 5 MG PO TABS: 5.0000 mg | ORAL_TABLET | ORAL | 0 refills | Status: AC | PRN

## 2022-11-26 MED ORDER — MAGNESIUM HYDROXIDE 400 MG/5ML PO SUSP
ORAL | Status: AC
  Administered 2022-11-26: 30 mL via ORAL
  Filled 2022-11-26: qty 30

## 2022-11-26 NOTE — Progress Notes (Signed)
  Subjective: 1 Day Post-Op Procedure(s) (LRB): COMPUTER ASSISTED TOTAL KNEE ARTHROPLASTY (Left) Patient reports pain as mild.   Patient is well, and has had no acute complaints or problems Plan is to go Home after hospital stay. Negative for chest pain and shortness of breath Fever: no Gastrointestinal: Negative for nausea and vomiting  Objective: Vital signs in last 24 hours: Temp:  [97.5 F (36.4 C)-98.4 F (36.9 C)] 97.5 F (36.4 C) (01/25 0544) Pulse Rate:  [89-114] 89 (01/25 0544) Resp:  [16-23] 18 (01/25 0544) BP: (135-168)/(70-91) 150/73 (01/25 0544) SpO2:  [94 %-100 %] 98 % (01/25 0544) Weight:  [78.9 kg] 78.9 kg (01/24 1529)  Intake/Output from previous day:  Intake/Output Summary (Last 24 hours) at 11/26/2022 6256 Last data filed at 11/26/2022 0400 Gross per 24 hour  Intake 2165 ml  Output 500 ml  Net 1665 ml    Intake/Output this shift: No intake/output data recorded.  Labs: No results for input(s): "HGB" in the last 72 hours. No results for input(s): "WBC", "RBC", "HCT", "PLT" in the last 72 hours. No results for input(s): "NA", "K", "CL", "CO2", "BUN", "CREATININE", "GLUCOSE", "CALCIUM" in the last 72 hours. No results for input(s): "LABPT", "INR" in the last 72 hours.   EXAM General - Patient is Alert and Oriented Extremity - Neurovascular intact Sensation intact distally Dorsiflexion/Plantar flexion intact Compartment soft Dressing/Incision - clean, dry, with a Hemovac tubing removed with no complication.  The tubing was intact on removal. Motor Function - intact, moving foot and toes well on exam.  Able to straight leg raise independently.  Ambulated 100 feet with physical therapy yesterday.  Past Medical History:  Diagnosis Date   Anxiety    Arthritis    Breast cancer ([place]) 06/25/1986   rt mastecomy/chemo  and rad   Complication of anesthesia    occ nausea   Depression    Deviated septum    Elevated cholesterol    Hypothyroidism     Neuromuscular disorder ([facility])    Neuropathy    Personal history of chemotherapy    Personal history of radiation therapy    Pulmonary hypertension ([facility])    Raynaud disease    Scleroderma ([facility])    Spastic colon     Assessment/Plan: 1 Day Post-Op Procedure(s) (LRB): COMPUTER ASSISTED TOTAL KNEE ARTHROPLASTY (Left) Principal Problem:   Total knee replacement status  Estimated body mass index is 27.24 kg/m as calculated from the following:   Height as of this encounter: '5\' 7"'$  (1.702 m).   Weight as of this encounter: 78.9 kg. Advance diet Up with therapy D/C IV fluids Discharge home with home health  DVT Prophylaxis - Lovenox, Foot Pumps, and TED hose Weight-Bearing as tolerated to left leg  Reche Dixon, PA-C Orthopaedic Surgery 11/26/2022, 7:12 AM

## 2022-11-26 NOTE — Progress Notes (Signed)
Physical Therapy Treatment Patient Details Name: Veronica Hoffman MRN: [id number] DOB: [birth year] Today's Date: 11/26/2022   History of Present Illness 82 y/o female s/p L TKA 11/25/22, h/o R TKA in 2017.    PT Comments    Pt continues to progress POD1 and does exceedingly well with all aspects of PT session.  She displayed very little pain or hesitancy, was able to easily do SLRs and was able to AROM flex knee to ~100* with little to no warm up, and was able to easily, safely and confidently ambulate 200 ft with consistent cadence and relatively minimal UE reliance on the walker.  Good overall effort and safe to return home with family assist when medically cleared.    Recommendations for follow up therapy are one component of a multi-disciplinary discharge planning process, led by the attending physician.  Recommendations may be updated based on patient status, additional functional criteria and insurance authorization.  Follow Up Recommendations  Home health PT     Assistance Recommended at Discharge Intermittent Supervision/Assistance  Patient can return home with the following Help with stairs or ramp for entrance;Assist for transportation;Assistance with cooking/housework;A little help with bathing/dressing/bathroom;A little help with walking and/or transfers   Equipment Recommendations  None recommended by PT    Recommendations for Other Services       Precautions / Restrictions Precautions Precautions: Fall Restrictions Weight Bearing Restrictions: Yes LLE Weight Bearing: Weight bearing as tolerated     Mobility  Bed Mobility Overal bed mobility: Modified Independent             General bed mobility comments: Pt again moves with confidence and w/o hesitation, easily gets herself to sitting EOB    Transfers Overall transfer level: Needs assistance Equipment used: Rolling walker (2 wheels) Transfers: Sit to/from Stand Sit to Stand: Supervision            General transfer comment: light reminders for appropriate set up (U&LEs) but was able to rise to standing w/o physical assist or hesitation    Ambulation/Gait Ambulation/Gait assistance: Supervision Gait Distance (Feet): 200 Feet Assistive device: Rolling walker (2 wheels)         General Gait Details: Pt with no hesitation/limp on the R, appropriate walker use and able to maintain consistent cadence.  Good safety, mild fatigue.   Stairs Stairs: Yes Stairs assistance: Supervision Stair Management: Two rails, Step to pattern Number of Stairs: 4 General stair comments: Pt able to recall appropriate strategy for stair negotiation, easily manages steps with b/l rails and only incedental cuing   Wheelchair Mobility    Modified Rankin (Stroke Patients Only)       Balance Overall balance assessment: Needs assistance Sitting-balance support: No upper extremity supported Sitting balance-Leahy Scale: Normal     Standing balance support: Bilateral upper extremity supported, During functional activity Standing balance-Leahy Scale: Good Standing balance comment: Good safety and confidence in standing w/ walker, no LOBs/unsteadiness                            Cognition Arousal/Alertness: Awake/alert Behavior During Therapy: WFL for tasks assessed/performed Overall Cognitive Status: Within Functional Limits for tasks assessed                                          Exercises Total Joint Exercises Ankle Circles/Pumps: AROM, 10 reps Quad Sets:  Strengthening, 10 reps Short Arc Quad: Strengthening, 10 reps Heel Slides: AROM, 10 reps (with resisted leg ext) Hip ABduction/ADduction: Strengthening, 10 reps Straight Leg Raises: AROM, 10 reps Knee Flexion: PROM, 5 reps Goniometric ROM: 0-104 AROM    General Comments        Pertinent Vitals/Pain Pain Assessment Pain Assessment: Faces Faces Pain Scale: Hurts a little bit Pain Location: L knee     Home Living                          Prior Function            PT Goals (current goals can now be found in the care plan section) Progress towards PT goals: Progressing toward goals    Frequency    BID      PT Plan Current plan remains appropriate    Co-evaluation              AM-PAC PT "6 Clicks" Mobility   Outcome Measure  Help needed turning from your back to your side while in a flat bed without using bedrails?: None Help needed moving from lying on your back to sitting on the side of a flat bed without using bedrails?: None Help needed moving to and from a bed to a chair (including a wheelchair)?: None Help needed standing up from a chair using your arms (e.g., wheelchair or bedside chair)?: None Help needed to walk in hospital room?: None Help needed climbing 3-5 steps with a railing? : None 6 Click Score: 24    End of Session Equipment Utilized During Treatment: Gait belt Activity Tolerance: Patient tolerated treatment well Patient left: in chair;with call bell/phone within reach Nurse Communication: Mobility status PT Visit Diagnosis: Muscle weakness (generalized) (M62.81);Difficulty in walking, not elsewhere classified (R26.2);Pain Pain - Right/Left: Left Pain - part of body: Knee     Time: 0737-1062 PT Time Calculation (min) (ACUTE ONLY): 26 min  Charges:  $Gait Training: 8-22 mins $Therapeutic Exercise: 8-22 mins                     Kreg Shropshire, DPT 11/26/2022, 9:54 AM

## 2022-11-26 NOTE — TOC Transition Note (Signed)
Transition of Care [facility]) - CM/SW Discharge Note   Patient Details  Name: Veronica Hoffman MRN: [id number] Date of Birth: 21-Sep-1941  [facility] Care [facility]) CM/SW Contact:  Beverly Sessions, RN Phone Number: 11/26/2022, 9:21 AM   Clinical Narrative:     [place] with [place] notified of discharge         Patient Goals and CMS Choice      Discharge Placement                         Discharge Plan and Services Additional resources added to the After Visit Summary for                                       Social Determinants of Health (SDOH) Interventions SDOH Screenings   Food Insecurity: No Food Insecurity (11/25/2022)  Housing: Low Risk  (11/25/2022)  Transportation Needs: No Transportation Needs (11/25/2022)  Utilities: Not At Risk (11/25/2022)  Tobacco Use: Low Risk  (11/25/2022)     Readmission Risk Interventions     No data to display

## 2022-11-26 NOTE — Evaluation (Signed)
Occupational Therapy Evaluation Patient Details Name: Veronica Hoffman MRN: [id number] DOB: [birth year] Today's Date: 11/26/2022   History of Present Illness 82 y/o female s/p L TKA 11/25/22, h/o R TKA in 2017.   Clinical Impression   Upon entering the room, pt seated in recliner chair and agreeable to OT evaluation. OT providing paper handout for [facility] care system and additional education covered in session related to increasing Ind with self care tasks. Pt stands with supervision and ambulates to bathroom with RW for toileting needs. While seated, pt threading pants onto B LEs with cuing for technique and min guard. Pt standing to pull clothing over B hips and perform hygiene needs. Pt stands at sink for hand hygiene without assistance and returns back to room. Pt stands to don UB clothing items and then returns to seated position in recliner chair. Pt reports feeling good about discharge plan. Pt does not need further skilled acute OT intervention at this time. OT to complete orders.      Recommendations for follow up therapy are one component of a multi-disciplinary discharge planning process, led by the attending physician.  Recommendations may be updated based on patient status, additional functional criteria and insurance authorization.   Follow Up Recommendations  No OT follow up     Assistance Recommended at Discharge PRN  Patient can return home with the following Assistance with cooking/housework;Help with stairs or ramp for entrance;Assist for transportation    Functional Status Assessment  Patient has not had a recent decline in their functional status  Equipment Recommendations  None recommended by OT       Precautions / Restrictions Precautions Precautions: Fall Restrictions Weight Bearing Restrictions: Yes LLE Weight Bearing: Weight bearing as tolerated      Mobility Bed Mobility               General bed mobility comments: seated in recliner chair     Transfers Overall transfer level: Needs assistance Equipment used: Rolling walker (2 wheels) Transfers: Sit to/from Stand Sit to Stand: Supervision                      ADL either performed or assessed with clinical judgement   ADL Overall ADL's : Needs assistance/impaired     Grooming: Wash/dry hands;Standing;Supervision/safety           Upper Body Dressing : Supervision/safety;Standing   Lower Body Dressing: Min guard;Sit to/from stand   Toilet Transfer: Supervision/safety;Comfort height toilet;Ambulation;Rolling walker (2 wheels)   Toileting- Clothing Manipulation and Hygiene: Supervision/safety;Sit to/from stand       Functional mobility during ADLs: Supervision/safety;Rolling walker (2 wheels)       Vision Patient Visual Report: No change from baseline              Pertinent Vitals/Pain Pain Assessment Pain Assessment: 0-10 Pain Score: 2  Pain Location: L knee Pain Descriptors / Indicators: Discomfort Pain Intervention(s): Monitored during session, Repositioned, Ice applied     Hand Dominance Right   Extremity/Trunk Assessment Upper Extremity Assessment Upper Extremity Assessment: Overall WFL for tasks assessed   Lower Extremity Assessment Lower Extremity Assessment: Overall WFL for tasks assessed       Communication Communication Communication: No difficulties   Cognition Arousal/Alertness: Awake/alert Behavior During Therapy: WFL for tasks assessed/performed Overall Cognitive Status: Within Functional Limits for tasks assessed  Home Living Family/patient expects to be discharged to:: Private residence Living Arrangements: Alone Available Help at Discharge: Family;Available 24 hours/day (brother and sister are going to stay with her) Type of Home: House Home Access: Stairs to enter [employer] of Steps: 3 Entrance Stairs-Rails: Can reach  both Home Layout: One level         Bathroom Toilet: Handicapped height     Home Equipment: Conservation officer, nature (2 wheels);BSC/3in1          Prior Functioning/Environment Prior Level of Function : Independent/Modified Independent;Driving;Working/employed             Mobility Comments: Pt reports that she stays very active, mowing yard, running beauty salon, etc ADLs Comments: Ind in all aspects of self care and IADLs                 OT Goals(Current goals can be found in the care plan section) Acute Rehab OT Goals Patient Stated Goal: to go home OT Goal Formulation: With patient Time For Goal Achievement: 11/26/22 Potential to Achieve Goals: Good  OT Frequency:         AM-PAC OT "6 Clicks" Daily Activity     Outcome Measure Help from another person eating meals?: None Help from another person taking care of personal grooming?: None Help from another person toileting, which includes using toliet, bedpan, or urinal?: None Help from another person bathing (including washing, rinsing, drying)?: A Little Help from another person to put on and taking off regular upper body clothing?: None Help from another person to put on and taking off regular lower body clothing?: A Little 6 Click Score: 22   End of Session Equipment Utilized During Treatment: Rolling walker (2 wheels) Nurse Communication: Mobility status  Activity Tolerance: Patient tolerated treatment well Patient left: in chair;with call bell/phone within reach                   Time: 0837-0902 OT Time Calculation (min): 25 min Charges:  OT General Charges $OT Visit: 1 Visit OT Evaluation $OT Eval Low Complexity: 1 Low OT Treatments $Self Care/Home Management : 8-22 mins  Darleen Crocker, MS, OTR/L , CBIS ascom [phone]  11/26/22, 10:46 AM

## 2022-11-26 NOTE — Discharge Summary (Signed)
Physician Discharge Summary  Subjective: 1 Day Post-Op Procedure(s) (LRB): COMPUTER ASSISTED TOTAL KNEE ARTHROPLASTY (Left) Patient reports pain as mild.   Patient seen in rounds with Dr. Marry Guan. Patient is well, and has had no acute complaints or problems Patient is ready to go home after physical therapy  Physician Discharge Summary  Patient ID: Veronica Hoffman MRN: [id number] DOB/AGE: [birth year] 82 y.o.  Admit date: 11/25/2022 Discharge date: 11/26/2022  Admission Diagnoses:  Discharge Diagnoses:  Principal Problem:   Total knee replacement status   Discharged Condition: fair  Hospital Course: The patient is postop day 1 from a left total knee arthroplasty.  She is doing well since surgery.  She ambulated 100 feet with physical therapy yesterday.  Her drain was removed this morning.  Her vitals have remained stable.  She is ready to go home with home health physical therapy.  Treatments: surgery:   Left total knee arthroplasty using computer-assisted navigation   SURGEON:  Marciano Sequin. M.D.   ANESTHESIA: spinal   ESTIMATED BLOOD LOSS: 50 mL   FLUIDS REPLACED: 1000 mL of crystalloid   TOURNIQUET TIME: 77 minutes   DRAINS: 2 medium Hemovac drains   SOFT TISSUE RELEASES: Anterior cruciate ligament, posterior cruciate ligament, deep medial collateral ligament, patellofemoral ligament   IMPLANTS UTILIZED: DePuy Attune size 6N posterior stabilized femoral component (cemented), size 4 rotating platform tibial component (cemented), 32 mm medialized dome patella (cemented), and a 5 mm stabilized rotating platform polyethylene insert.  Discharge Exam: Blood pressure (!) 150/73, pulse 89, temperature (!) 97.5 F (36.4 C), temperature source Temporal, resp. rate 18, height '5\' 7"'$  (1.702 m), weight 78.9 kg, SpO2 98 %.   Disposition: Discharge disposition: 01-Home or Self Care        Allergies as of 11/26/2022       Reactions   Aspirin Other (See Comments)   GI  Bleed   Boniva [ibandronic Acid] Nausea And Vomiting   Codeine Nausea And Vomiting   Demerol [meperidine] Nausea And Vomiting   Fosamax [alendronate] Nausea And Vomiting   Norvasc [amlodipine] Other (See Comments)   unknown   Propoxyphene Nausea And Vomiting   Tape Rash   bandaids        Medication List     TAKE these medications    artificial tears ointment Place 1 drop into both eyes as needed.   atorvastatin 40 MG tablet Commonly known as: LIPITOR Take 40 mg by mouth daily at 6 PM.   B-12 2500 MCG Tabs Take 1 capsule by mouth every morning.   Biotin 2500 MCG Caps Take 1 tablet by mouth daily.   calcium carbonate 600 MG Tabs tablet Commonly known as: OS-CAL Take 600 mg by mouth 2 (two) times daily with a meal.   celecoxib 200 MG capsule Commonly known as: CELEBREX Take 1 capsule (200 mg total) by mouth 2 (two) times daily.   CORRECTOL PO Take 1 capsule by mouth at bedtime.   D3 High Potency 25 MCG (1000 UT) capsule Generic drug: Cholecalciferol Take 1,000 Units by mouth daily.   dimenhyDRINATE 50 MG tablet Commonly known as: DRAMAMINE Take 50 mg by mouth every 8 (eight) hours as needed.   enoxaparin 40 MG/0.4ML injection Commonly known as: LOVENOX Inject 0.4 mLs (40 mg total) into the skin daily for 14 days.   fenofibrate micronized 67 MG capsule Commonly known as: LOFIBRA Take 67 mg by mouth daily before breakfast.   fluticasone 50 MCG/ACT nasal spray Commonly known as: Ellis  2 sprays into both nostrils as needed for allergies or rhinitis.   Glucosamine Sulfate 1000 MG Caps Take 2 capsules by mouth every morning.   levothyroxine 75 MCG tablet Commonly known as: SYNTHROID Take 75 mcg by mouth.   loratadine 10 MG tablet Commonly known as: CLARITIN Take 10 mg by mouth daily.   NIFEdipine 30 MG 24 hr tablet Commonly known as: ADALAT CC Take 30 mg by mouth daily.   NONFORMULARY OR COMPOUNDED [place]  Combination Pain  Cream -  Baclofen 2%, Doxepin 5%, Gabapentin 6%, Topiramate 2%, Pentoxifylline 3% Apply 1-2 grams to affected area 3-4 times daily Qty. 120 gm  3 refills   omeprazole 20 MG capsule Commonly known as: PRILOSEC Take 20 mg by mouth at bedtime.   oxyCODONE 5 MG immediate release tablet Commonly known as: Oxy IR/ROXICODONE Take 1 tablet (5 mg total) by mouth every 4 (four) hours as needed for moderate pain (pain score 4-6).   traMADol 50 MG tablet Commonly known as: ULTRAM Take 1-2 tablets (50-100 mg total) by mouth every 4 (four) hours as needed for moderate pain.   trolamine salicylate 10 % cream Commonly known as: ASPERCREME Apply 1 Application topically as needed for muscle pain.   TYLENOL ARTHRITIS PAIN PO Take 2 capsules by mouth at bedtime.   venlafaxine XR 150 MG 24 hr capsule Commonly known as: EFFEXOR-XR Take 150 mg by mouth at bedtime.   vitamin E 180 MG (400 UNITS) capsule Take 400 Units by mouth daily.   WOMENS DAILY FORMULA PO Take 1 capsule by mouth every morning.               Durable Medical Equipment  (From admission, onward)           Start     Ordered   11/25/22 1622  DME Walker rolling  Once       Question:  Patient needs a walker to treat with the following condition  Answer:  Total knee replacement status   11/25/22 1621   11/25/22 1622  DME Bedside commode  Once       Comments: Patient is not able to walk the distance required to go the bathroom, or he/she is unable to safely negotiate stairs required to access the bathroom.  A 3in1 BSC will alleviate this problem  Question:  Patient needs a bedside commode to treat with the following condition  Answer:  Total knee replacement status   11/25/22 1621            Follow-up Information     Watt Climes, PA Follow up on 12/09/2022.   Specialty: Physician Assistant Why: at 10:15 am Contact information: [place] [address] [phone]         Dereck Leep, MD Follow up on 01/07/2023.   Specialty: Orthopedic Surgery Why: at 2:00 pm Contact information: [place] [address] [phone]                 Signed: Prescott Parma, Noble Bodie 11/26/2022, 7:15 AM   Objective: Vital signs in last 24 hours: Temp:  [97.5 F (36.4 C)-98.4 F (36.9 C)] 97.5 F (36.4 C) (01/25 0544) Pulse Rate:  [89-114] 89 (01/25 0544) Resp:  [16-23] 18 (01/25 0544) BP: (135-168)/(70-91) 150/73 (01/25 0544) SpO2:  [94 %-100 %] 98 % (01/25 0544) Weight:  [78.9 kg] 78.9 kg (01/24 1529)  Intake/Output from previous day:  Intake/Output Summary (Last 24 hours)  at 11/26/2022 0715 Last data filed at 11/26/2022 0400 Gross per 24 hour  Intake 2165 ml  Output 500 ml  Net 1665 ml    Intake/Output this shift: No intake/output data recorded.  Labs: No results for input(s): "HGB" in the last 72 hours. No results for input(s): "WBC", "RBC", "HCT", "PLT" in the last 72 hours. No results for input(s): "NA", "K", "CL", "CO2", "BUN", "CREATININE", "GLUCOSE", "CALCIUM" in the last 72 hours. No results for input(s): "LABPT", "INR" in the last 72 hours.  EXAM: General - Patient is Alert and Oriented Extremity - Neurovascular intact Sensation intact distally Dorsiflexion/Plantar flexion intact Compartment soft Incision - clean, dry, with the Hemovac tubing removed with no complication. Motor Function -plantarflexion and dorsiflexion are intact.  Able to straight leg raise independently.  Assessment/Plan: 1 Day Post-Op Procedure(s) (LRB): COMPUTER ASSISTED TOTAL KNEE ARTHROPLASTY (Left) Procedure(s) (LRB): COMPUTER ASSISTED TOTAL KNEE ARTHROPLASTY (Left) Past Medical History:  Diagnosis Date   Anxiety    Arthritis    Breast cancer ([place]) 06/25/1986   rt mastecomy/chemo  and rad   Complication of anesthesia    occ nausea   Depression    Deviated septum    Elevated cholesterol    Hypothyroidism    Neuromuscular disorder  ([place] [facility])    Neuropathy    Personal history of chemotherapy    Personal history of radiation therapy    Pulmonary hypertension ([place])    Raynaud disease    Scleroderma ([place])    Spastic colon    Principal Problem:   Total knee replacement status  Estimated body mass index is 27.24 kg/m as calculated from the following:   Height as of this encounter: '5\' 7"'$  (1.702 m).   Weight as of this encounter: 78.9 kg. Advance diet Up with therapy D/C IV fluids Discharge home with home health Diet - Regular diet Follow up - in 2 weeks Activity - WBAT Disposition - Home Condition Upon Discharge - Stable DVT Prophylaxis - Lovenox and TED hose  Reche Dixon, PA-C Orthopaedic Surgery 11/26/2022, 7:15 AM

## 2023-04-19 IMAGING — MG DIGITAL SCREENING UNILAT LEFT W/ TOMO W/ CAD
4 series · 4 of 12 positions shown · non-contrast
Comparison: Previous exam(s).

CLINICAL DATA: Screening.

EXAM:
DIGITAL SCREENING UNILATERAL LEFT MAMMOGRAM WITH CAD AND
TOMOSYNTHESIS
TECHNIQUE: Left screening digital craniocaudal and mediolateral oblique
mammograms were obtained. Left screening digital breast
tomosynthesis was performed. The images were evaluated with
computer-aided detection.

[L MLO synth-2D]
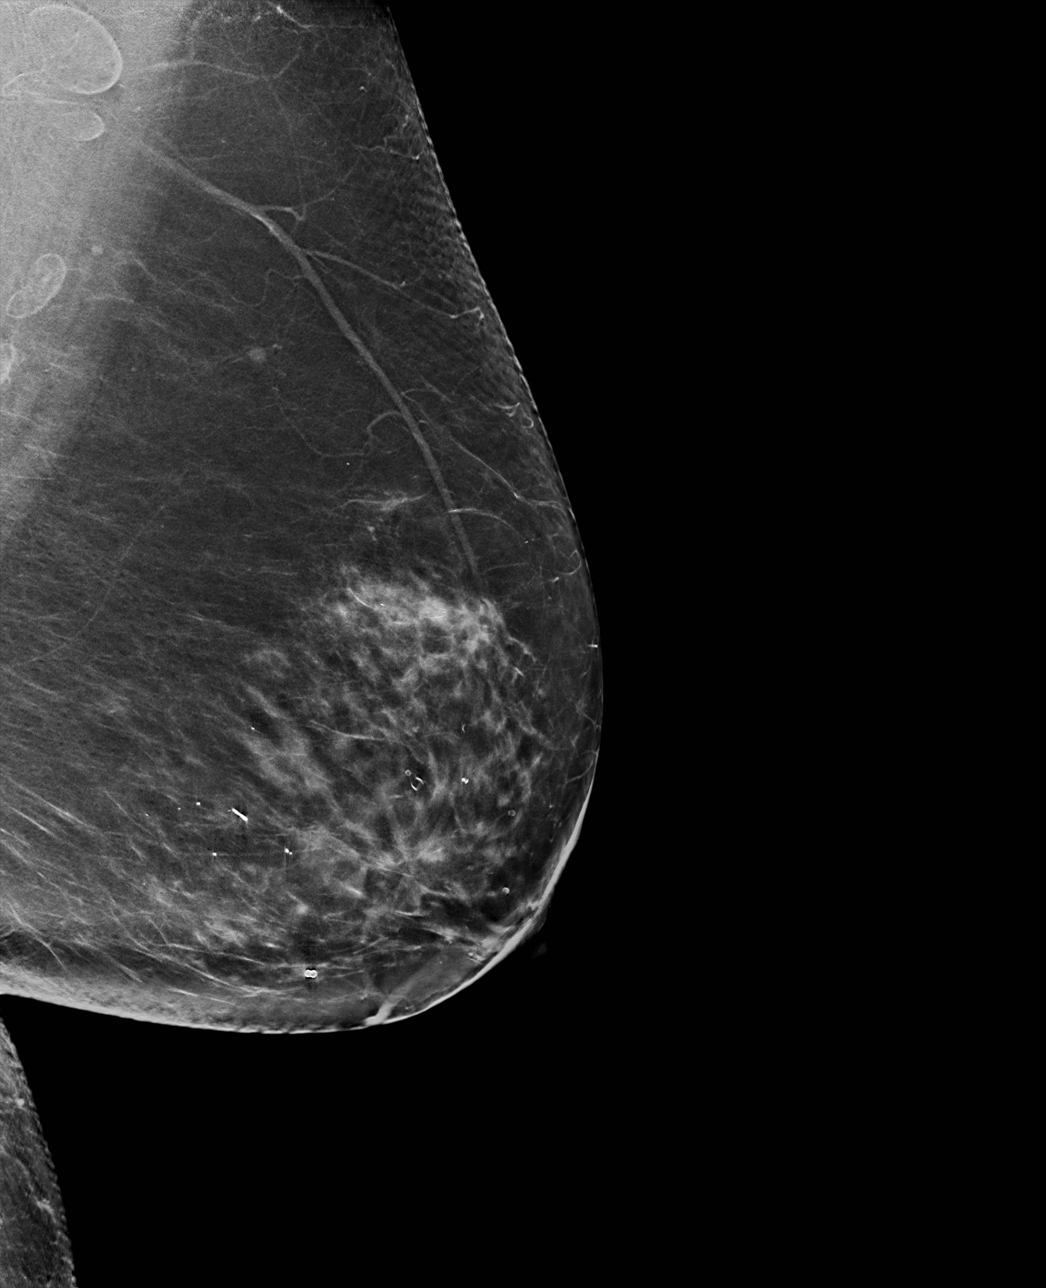

[L CC synth-2D]
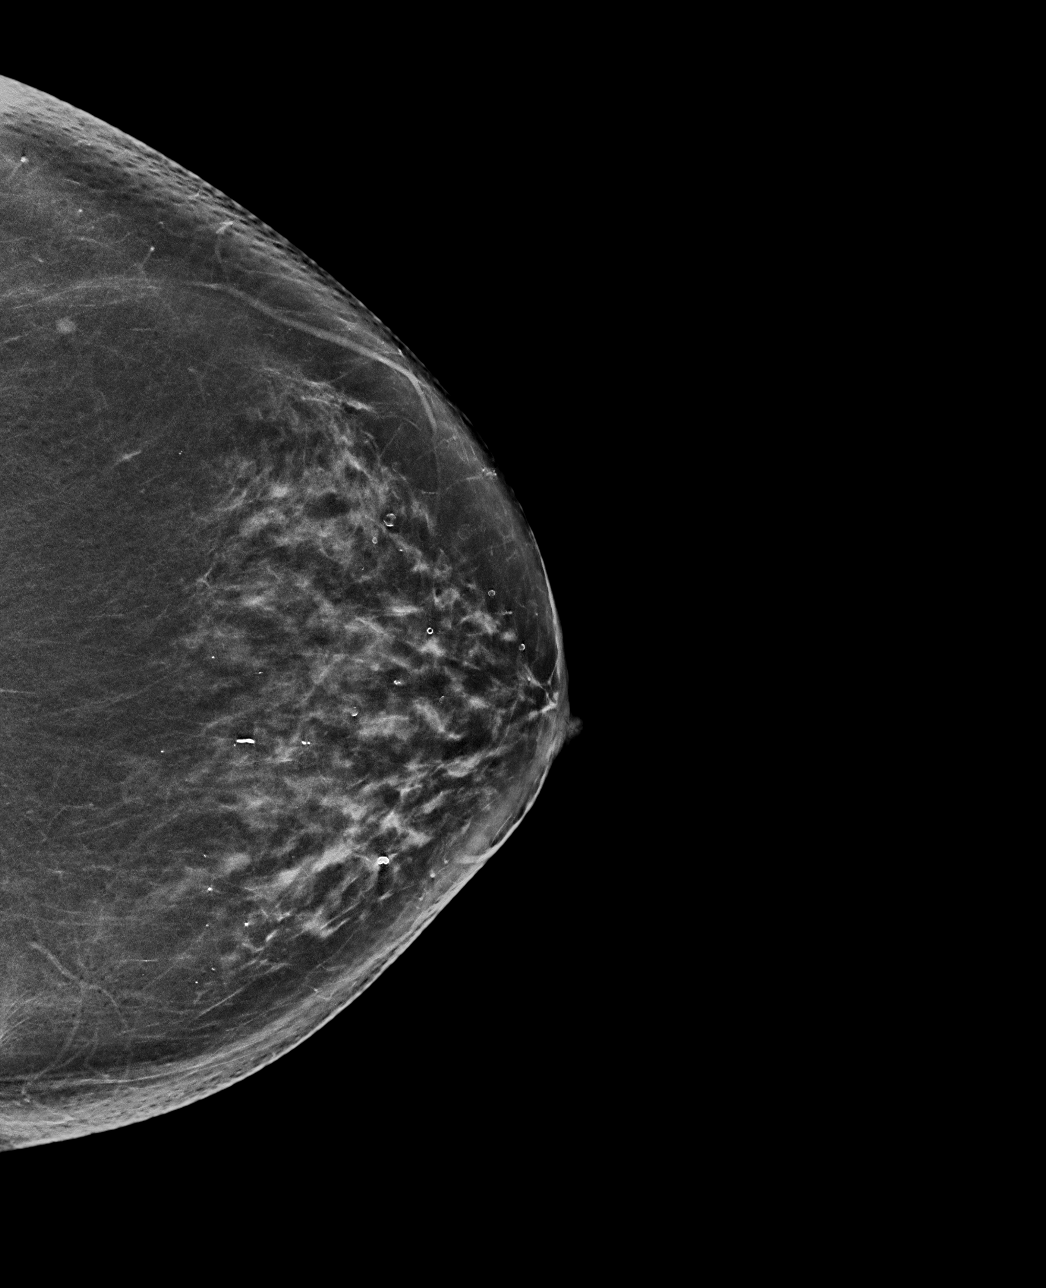

[L MLO tomo · tomo slice 40/79.0]
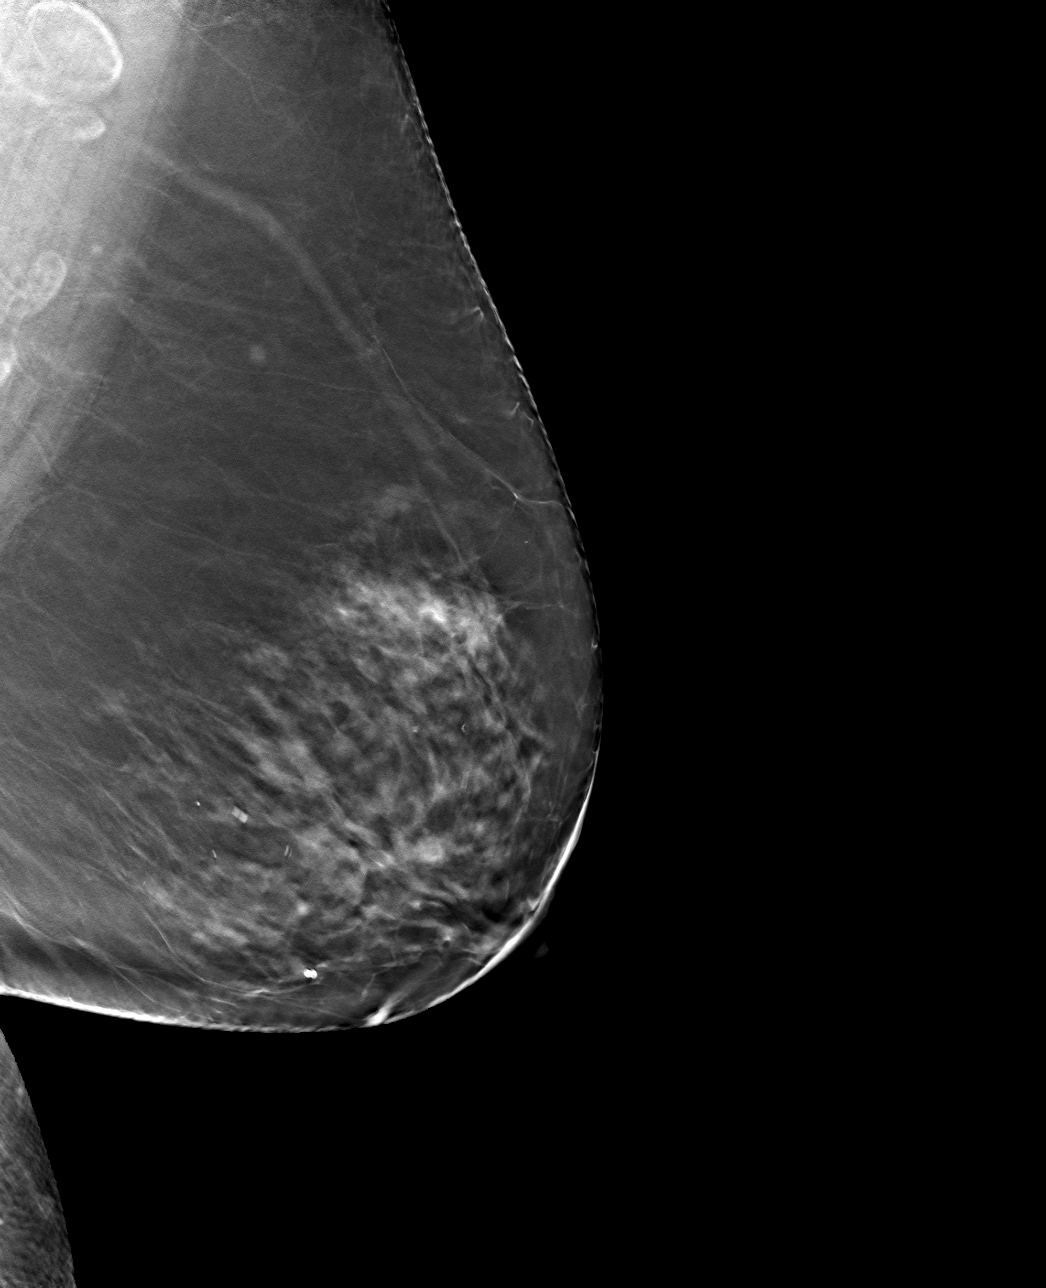

[L CC tomo · tomo slice 37/72.0]
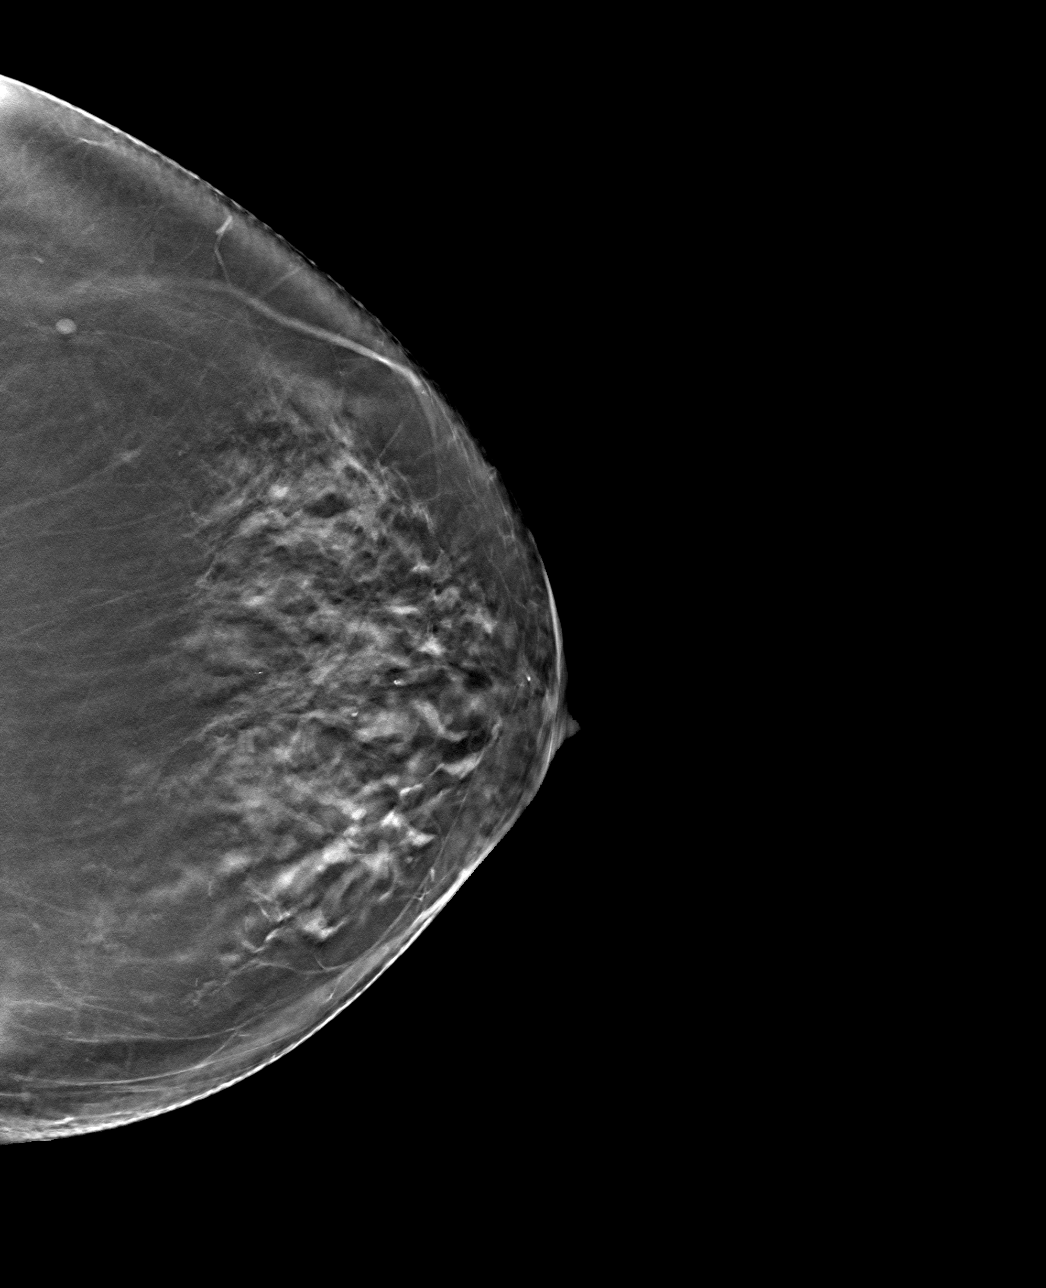

[4 of 12 positions shown; findings below may reference images not displayed]

ACR Breast Density Category c: The breast tissue is heterogeneously
dense, which may obscure small masses.
FINDINGS: The patient has had a right mastectomy. There are no findings
suspicious for malignancy.
IMPRESSION: No mammographic evidence of malignancy. A result letter of this
screening mammogram will be mailed directly to the patient.

RECOMMENDATION:
Screening mammogram in one year.  (Code:BI-E-TXI)

BI-RADS CATEGORY  1: Neg BI-RADS category 1-negative ative.

## 2023-06-08 ENCOUNTER — Other Ambulatory Visit: Payer: Self-pay | Admitting: Family Medicine

## 2023-06-08 DIAGNOSIS — Z1231 Encounter for screening mammogram for malignant neoplasm of breast: Secondary | ICD-10-CM

## 2023-07-01 ENCOUNTER — Ambulatory Visit
Admission: RE | Admit: 2023-07-01 | Discharge: 2023-07-01 | Disposition: A | Discharge status: Home / Self Care | Payer: Medicare Other | Source: Ambulatory Visit | Attending: Family Medicine | Admitting: Family Medicine

## 2023-07-01 DIAGNOSIS — Z1231 Encounter for screening mammogram for malignant neoplasm of breast: Secondary | ICD-10-CM | POA: Diagnosis present

## 2024-06-19 ENCOUNTER — Other Ambulatory Visit: Payer: Self-pay | Admitting: Family Medicine

## 2024-06-19 DIAGNOSIS — Z1231 Encounter for screening mammogram for malignant neoplasm of breast: Secondary | ICD-10-CM

## 2024-07-20 ENCOUNTER — Ambulatory Visit
Admission: RE | Admit: 2024-07-20 | Discharge: 2024-07-20 | Disposition: A | Discharge status: Home / Self Care | Source: Ambulatory Visit | Attending: Family Medicine | Admitting: Family Medicine

## 2024-07-20 DIAGNOSIS — Z1231 Encounter for screening mammogram for malignant neoplasm of breast: Secondary | ICD-10-CM | POA: Insufficient documentation
